# Patient Record
Sex: Female | Born: 1990
Health system: Southern US, Community
[De-identification: ages and names within clinical notes are randomized; demographics above are authoritative.]

## PROBLEM LIST (undated history)

## (undated) DIAGNOSIS — A599 Trichomoniasis, unspecified: Secondary | ICD-10-CM

## (undated) DIAGNOSIS — D573 Sickle-cell trait: Secondary | ICD-10-CM

## (undated) DIAGNOSIS — Z8659 Personal history of other mental and behavioral disorders: Secondary | ICD-10-CM

## (undated) DIAGNOSIS — I10 Essential (primary) hypertension: Secondary | ICD-10-CM

## (undated) DIAGNOSIS — A749 Chlamydial infection, unspecified: Secondary | ICD-10-CM

## (undated) DIAGNOSIS — E119 Type 2 diabetes mellitus without complications: Secondary | ICD-10-CM

## (undated) HISTORY — DX: Personal history of other mental and behavioral disorders: Z86.59

## (undated) HISTORY — DX: Type 2 diabetes mellitus without complications: E11.9

## (undated) HISTORY — DX: Morbid (severe) obesity due to excess calories: E66.01

## (undated) HISTORY — DX: Sickle-cell trait: D57.3

## (undated) HISTORY — PX: TUBAL LIGATION: SHX77

## (undated) HISTORY — DX: Essential (primary) hypertension: I10

---

## 2018-05-05 DIAGNOSIS — A749 Chlamydial infection, unspecified: Secondary | ICD-10-CM

## 2018-05-05 HISTORY — DX: Chlamydial infection, unspecified: A74.9

## 2018-06-06 ENCOUNTER — Inpatient Hospital Stay (HOSPITAL_COMMUNITY)
Admission: AD | Admit: 2018-06-06 | Discharge: 2018-06-06 | Disposition: A | Discharge status: Home / Self Care | Payer: Self-pay | Attending: Obstetrics and Gynecology | Admitting: Obstetrics and Gynecology

## 2018-06-06 ENCOUNTER — Encounter (HOSPITAL_COMMUNITY): Payer: Self-pay | Admitting: *Deleted

## 2018-06-06 DIAGNOSIS — N946 Dysmenorrhea, unspecified: Secondary | ICD-10-CM

## 2018-06-06 DIAGNOSIS — N939 Abnormal uterine and vaginal bleeding, unspecified: Secondary | ICD-10-CM

## 2018-06-06 HISTORY — DX: Chlamydial infection, unspecified: A74.9

## 2018-06-06 LAB — WET PREP, GENITAL
CLUE CELLS WET PREP: NONE SEEN
Sperm: NONE SEEN
TRICH WET PREP: NONE SEEN
YEAST WET PREP: NONE SEEN

## 2018-06-06 LAB — URINALYSIS, ROUTINE W REFLEX MICROSCOPIC
Bilirubin Urine: NEGATIVE
Glucose, UA: NEGATIVE mg/dL
HGB URINE DIPSTICK: NEGATIVE
Ketones, ur: NEGATIVE mg/dL
Leukocytes, UA: NEGATIVE
Nitrite: NEGATIVE
PH: 6 (ref 5.0–8.0)
PROTEIN: NEGATIVE mg/dL
Specific Gravity, Urine: 1.014 (ref 1.005–1.030)

## 2018-06-06 LAB — CBC
HCT: 39.8 % (ref 36.0–46.0)
Hemoglobin: 12.5 g/dL (ref 12.0–15.0)
MCH: 23.7 pg — ABNORMAL LOW (ref 26.0–34.0)
MCHC: 31.4 g/dL (ref 30.0–36.0)
MCV: 75.5 fL — ABNORMAL LOW (ref 80.0–100.0)
NRBC: 0 % (ref 0.0–0.2)
PLATELETS: 224 10*3/uL (ref 150–400)
RBC: 5.27 MIL/uL — ABNORMAL HIGH (ref 3.87–5.11)
RDW: 15.8 % — AB (ref 11.5–15.5)
WBC: 11.2 10*3/uL — AB (ref 4.0–10.5)

## 2018-06-06 LAB — POCT PREGNANCY, URINE: PREG TEST UR: NEGATIVE

## 2018-06-06 MED ORDER — KETOROLAC TROMETHAMINE 60 MG/2ML IM SOLN
60.0000 mg | Freq: Once | INTRAMUSCULAR | Status: AC
  Administered 2018-06-06: 60 mg via INTRAMUSCULAR
  Filled 2018-06-06: qty 2

## 2018-06-06 NOTE — MAU Note (Signed)
No period in October. About 2hrs ago had some abd pain in RLQ. Had heavy vag bleeding and clots for 2hrs and then completely stopped. Had BM and pain worse at first. Threw up once. Some dizziness earllier. Pain is less now about 5. Pain is on CS scar RLQ. No bleeding now. Stabbing pain

## 2018-06-06 NOTE — Discharge Instructions (Signed)
[facility]  ° ° °[facility]       Phone: [phone] ° °[facility] Phone: [phone] ° °[facility] [place]  Phone: [phone] ° °[facility] [place]  Phone: [phone] ° °[facility] [place]  Phone: [phone] ° °[facility]       Phone: [phone] ° °[facility]    Phone: [phone]  ° °[facility] ([place]    Phone: [phone] ° °[facility]    Phone: [phone] ° °[facility]    Phone: [phone] ° °[facility]    Phone: [phone] ° °[facility]       Phone: [phone]  ° °[facility]  Phone: [phone] ° °[facility]    Phone: [phone] ° °[facility]    Phone: [phone] ° °[facility]      Phone: [phone] ° °[facility]    Phone: [phone] ° ° ° °Abnormal Uterine Bleeding °Abnormal uterine bleeding means bleeding more than usual from your uterus. It can include: °· Bleeding between periods. °· Bleeding after sex. °· Bleeding that is heavier than normal. °· Periods that last longer than usual. °· Bleeding after you have stopped having your period (menopause). ° °There are many problems that may cause this. You should see a doctor for any kind of bleeding that is not normal. Treatment depends on the cause of the bleeding. °Follow these instructions at home: °· Watch your condition for any changes. °· Do not use tampons, douche, or have sex, if your doctor tells you not to. °· Change your pads often. °· Get regular well-woman exams. Make sure they include a pelvic exam and cervical cancer screening. °· Keep all follow-up visits as told by your doctor.  This is important. °Contact a doctor if: °· The bleeding lasts more than one week. °· You feel dizzy at times. °· You feel like you are going to throw up (nauseous). °· You throw up. °Get help right away if: °· You pass out. °· You have to change pads every hour. °· You have belly (abdominal) pain. °· You have a fever. °· You get sweaty. °· You get weak. °· You passing large blood clots from your vagina. °Summary °· Abnormal uterine bleeding means bleeding more than usual from your uterus. °· There are many problems that may cause this. You should see a doctor for any kind of bleeding that is not normal. °· Treatment depends on the cause of the bleeding. °This information is not intended to replace advice given to you by your health care provider. Make sure you discuss any questions you have with your health care provider. °Document Released: 05/19/2009 Document Revised: 07/16/2016 Document Reviewed: 07/16/2016 °Elsevier Interactive Patient Education © 2017 [employer] Inc[employer] ° °

## 2018-06-06 NOTE — MAU Provider Note (Signed)
History     CSN: [id number]  Arrival date and time: 06/06/18 1858   First Provider Initiated Contact with Patient 06/06/18 1947      Chief Complaint  Patient presents with  . Abdominal Pain   HPI Kristin Wood is a 27 y.o. G3P3000 non pregnant female who presents via EMS for vaginal bleeding and pain. She states this evening she had a 2 hour episode of bright red bleeding that has since stopped. She states it is time for her normal cycle. She reports severe cramping during that time but none now. She states the bleeding has since stopped. She reports being treated for trich last month but not telling her partner she had it. She has had unprotected intercourse since then. She has had a BTL and reports irregular periods since then.   OB History    Gravida  3   Para  3   Term  3   Preterm      AB      Living        SAB      TAB      Ectopic      Multiple      Live Births              Past Medical History:  Diagnosis Date  . Chlamydia 05/2018    Past Surgical History:  Procedure Laterality Date  . CESAREAN SECTION      History reviewed. No pertinent family history.  Social History   Tobacco Use  . Smoking status: Not on file  Substance Use Topics  . Alcohol use: Not on file  . Drug use: Not on file    Allergies: No Known Allergies  No medications prior to admission.    Review of Systems  Constitutional: Negative.  Negative for fatigue and fever.  HENT: Negative.   Respiratory: Negative.  Negative for shortness of breath.   Cardiovascular: Negative.  Negative for chest pain.  Gastrointestinal: Positive for abdominal pain. Negative for constipation, diarrhea, nausea and vomiting.  Genitourinary: Positive for vaginal bleeding. Negative for dysuria.  Neurological: Negative.  Negative for dizziness and headaches.   Physical Exam   Blood pressure (!) 158/91, pulse 89, temperature 98.2 F (36.8 C), resp. rate 18, height 5\' 3"  (1.6 m), weight  (!) 166 kg, last menstrual period 04/27/2018.  Physical Exam  Nursing note and vitals reviewed. Constitutional: She is oriented to person, place, and time. She appears well-developed and well-nourished. No distress.  HENT:  Head: Normocephalic.  Eyes: Pupils are equal, round, and reactive to light.  Cardiovascular: Normal rate, regular rhythm and normal heart sounds.  Respiratory: Effort normal and breath sounds normal. No respiratory distress.  GI: Soft. Bowel sounds are normal. She exhibits no distension. There is no tenderness.  Genitourinary: Vaginal discharge (Small amount of yellow discharge) found.  Genitourinary Comments: Pelvic exam: Cervix pink, visually closed, without lesion, vaginal walls and external genitalia normal Bimanual exam: Cervix 0/long/high, firm, anterior, neg CMT, uterus nontender, nonenlarged, adnexa without tenderness, enlargement, or mass   Neurological: She is alert and oriented to person, place, and time.  Skin: Skin is warm and dry.  Psychiatric: She has a normal mood and affect. Her behavior is normal. Judgment and thought content normal.    MAU Course  Procedures Results for orders placed or performed during the hospital encounter of 06/06/18 (from the past 24 hour(s))  Urinalysis, Routine w reflex microscopic     Status: None   Collection Time:  06/06/18  7:25 PM  Result Value Ref Range   Color, Urine YELLOW YELLOW   APPearance CLEAR CLEAR   Specific Gravity, Urine 1.014 1.005 - 1.030   pH 6.0 5.0 - 8.0   Glucose, UA NEGATIVE NEGATIVE mg/dL   Hgb urine dipstick NEGATIVE NEGATIVE   Bilirubin Urine NEGATIVE NEGATIVE   Ketones, ur NEGATIVE NEGATIVE mg/dL   Protein, ur NEGATIVE NEGATIVE mg/dL   Nitrite NEGATIVE NEGATIVE   Leukocytes, UA NEGATIVE NEGATIVE  Pregnancy, urine POC     Status: None   Collection Time: 06/06/18  7:35 PM  Result Value Ref Range   Preg Test, Ur NEGATIVE NEGATIVE  CBC     Status: Abnormal   Collection Time: 06/06/18  7:57  PM  Result Value Ref Range   WBC 11.2 (H) 4.0 - 10.5 K/uL   RBC 5.27 (H) 3.87 - 5.11 MIL/uL   Hemoglobin 12.5 12.0 - 15.0 g/dL   HCT 16.1 09.6 - 04.5 %   MCV 75.5 (L) 80.0 - 100.0 fL   MCH 23.7 (L) 26.0 - 34.0 pg   MCHC 31.4 30.0 - 36.0 g/dL   RDW 40.9 (H) 81.1 - 91.4 %   Platelets 224 150 - 400 K/uL   nRBC 0.0 0.0 - 0.2 %  Wet prep, genital     Status: Abnormal   Collection Time: 06/06/18  9:01 PM  Result Value Ref Range   Yeast Wet Prep HPF POC NONE SEEN NONE SEEN   Trich, Wet Prep NONE SEEN NONE SEEN   Clue Cells Wet Prep HPF POC NONE SEEN NONE SEEN   WBC, Wet Prep HPF POC FEW (A) NONE SEEN   Sperm NONE SEEN    MDM UA, UPT Wet prep and gc/chlamydia Toradol IM- patient reports relief from pain  Assessment and Plan   1. Abnormal vaginal bleeding   2. Menstrual cramp    -Discharge home in stable condition -Encouraged patient to use ibuprofen as needed for pain -Vaginal bleeding and safe sexual precautions discussed -Patient advised to follow-up with gyn of choice for routine needs, list given -Patient may return to MAU as needed or if her condition were to change or worsen   Rolm Bookbinder CNM 06/06/2018, 9:01 PM

## 2018-06-08 LAB — GC/CHLAMYDIA PROBE AMP (~~LOC~~) NOT AT ARMC
CHLAMYDIA, DNA PROBE: NEGATIVE
NEISSERIA GONORRHEA: NEGATIVE

## 2019-05-27 ENCOUNTER — Emergency Department (HOSPITAL_BASED_OUTPATIENT_CLINIC_OR_DEPARTMENT_OTHER)
Admission: EM | Admit: 2019-05-27 | Discharge: 2019-05-27 | Disposition: A | Discharge status: Home / Self Care | Payer: Medicaid - Out of State | Attending: Emergency Medicine | Admitting: Emergency Medicine

## 2019-05-27 ENCOUNTER — Encounter (HOSPITAL_BASED_OUTPATIENT_CLINIC_OR_DEPARTMENT_OTHER): Payer: Self-pay | Admitting: *Deleted

## 2019-05-27 ENCOUNTER — Other Ambulatory Visit: Payer: Self-pay

## 2019-05-27 DIAGNOSIS — B3731 Acute candidiasis of vulva and vagina: Secondary | ICD-10-CM

## 2019-05-27 DIAGNOSIS — F17210 Nicotine dependence, cigarettes, uncomplicated: Secondary | ICD-10-CM | POA: Diagnosis not present

## 2019-05-27 DIAGNOSIS — N899 Noninflammatory disorder of vagina, unspecified: Secondary | ICD-10-CM | POA: Diagnosis present

## 2019-05-27 DIAGNOSIS — B373 Candidiasis of vulva and vagina: Secondary | ICD-10-CM | POA: Insufficient documentation

## 2019-05-27 LAB — URINALYSIS, MICROSCOPIC (REFLEX)

## 2019-05-27 LAB — URINALYSIS, ROUTINE W REFLEX MICROSCOPIC
Bilirubin Urine: NEGATIVE
Glucose, UA: >=500 mg/dL — AB
Hgb urine dipstick: NEGATIVE
Ketones, ur: NEGATIVE mg/dL
Leukocytes,Ua: NEGATIVE
Nitrite: NEGATIVE
Protein, ur: NEGATIVE mg/dL
Specific Gravity, Urine: 1.02 (ref 1.005–1.030)
pH: 6 (ref 5.0–8.0)

## 2019-05-27 LAB — PREGNANCY, URINE: Preg Test, Ur: NEGATIVE

## 2019-05-27 LAB — WET PREP, GENITAL
Clue Cells Wet Prep HPF POC: NONE SEEN
Sperm: NONE SEEN
Trich, Wet Prep: NONE SEEN

## 2019-05-27 LAB — HIV ANTIBODY (ROUTINE TESTING W REFLEX): HIV Screen 4th Generation wRfx: NONREACTIVE

## 2019-05-27 MED ORDER — KETOCONAZOLE 2 % EX CREA: 1.0000 "application " | TOPICAL_CREAM | Freq: Every day | CUTANEOUS | 0 refills | Status: DC

## 2019-05-27 MED ORDER — FLUCONAZOLE 150 MG PO TABS
150.0000 mg | ORAL_TABLET | Freq: Once | ORAL | Status: AC
  Administered 2019-05-27: 150 mg via ORAL
  Filled 2019-05-27: qty 1

## 2019-05-27 MED FILL — KETOCONAZOLE 2% CREAM: 2 | 14 days supply | Qty: 30 | Fill #0

## 2019-05-27 NOTE — Discharge Instructions (Signed)
Contact a health care provider if:  You have a fever.  Your symptoms go away and then return.  Your symptoms do not get better with treatment.  Your symptoms get worse.  You have new symptoms.  You develop blisters in or around your vagina.  You have blood coming from your vagina and it is not your menstrual period.  You develop pain in your abdomen.

## 2019-05-27 NOTE — ED Provider Notes (Signed)
[facility] EMERGENCY DEPARTMENT Provider Note   CSN: [id number] Arrival date & time: 05/27/19  1106     History   Chief Complaint Chief Complaint  Patient presents with  . Vaginal Discharge    HPI Kristin Wood is a 28 y.o. female who presents with vaginal discharge. She states that she was diagnosed with trich 32weeks ago and treated, but on the last day of abx had intercourse with husband who has not yet been treated and had recurrence of green discharge and vaginal irritation.  She also has a hx of HSV2 with concurrent outbreak. On Valtrex. Feels irritated with painful blisters. Patient requests self swab and declines pelvic exam. Patient was treated for PID at the time      HPI  Past Medical History:  Diagnosis Date  . Chlamydia 05/2018    There are no active problems to display for this patient.   Past Surgical History:  Procedure Laterality Date  . CESAREAN SECTION       OB History    Gravida  3   Para  3   Term  3   Preterm      AB      Living        SAB      TAB      Ectopic      Multiple      Live Births               Home Medications    Prior to Admission medications   Not on File    Family History History reviewed. No pertinent family history.  Social History Social History   Tobacco Use  . Smoking status: Current Every Day Smoker  . Smokeless tobacco: Never Used  Substance Use Topics  . Alcohol use: Not on file  . Drug use: Not on file     Allergies   Patient has no known allergies.   Review of Systems Review of Systems Ten systems reviewed and are negative for acute change, except as noted in the HPI.    Physical Exam Updated Vital Signs Ht 5\' 3"  (1.6 m)   Wt (!) 171.5 kg   LMP 05/06/2019   BMI 66.96 kg/m   Physical Exam Vitals signs and nursing note reviewed.  Constitutional:      General: She is not in acute distress.    Appearance: She is well-developed. She is obese. She is not  diaphoretic.  HENT:     Head: Normocephalic and atraumatic.  Eyes:     General: No scleral icterus.    Conjunctiva/sclera: Conjunctivae normal.  Neck:     Musculoskeletal: Normal range of motion.  Cardiovascular:     Rate and Rhythm: Normal rate and regular rhythm.     Heart sounds: Normal heart sounds. No murmur. No friction rub. No gallop.   Pulmonary:     Effort: Pulmonary effort is normal. No respiratory distress.     Breath sounds: Normal breath sounds.  Abdominal:     General: Bowel sounds are normal. There is no distension.     Palpations: Abdomen is soft. There is no mass.     Tenderness: There is no abdominal tenderness. There is no guarding.  Skin:    General: Skin is warm and dry.  Neurological:     Mental Status: She is alert and oriented to person, place, and time.  Psychiatric:        Behavior: Behavior normal.  ED Treatments / Results  Labs (all labs ordered are listed, but only abnormal results are displayed) Labs Reviewed  WET PREP, GENITAL  RPR  URINALYSIS, ROUTINE W REFLEX MICROSCOPIC  HIV ANTIBODY (ROUTINE TESTING W REFLEX)  PREGNANCY, URINE  GC/CHLAMYDIA PROBE AMP ([place]) NOT AT [facility]    EKG None  Radiology No results found.  Procedures Procedures (including critical care time)  Medications Ordered in ED Medications - No data to display   Initial Impression / Assessment and Plan / ED Course  I have reviewed the triage vital signs and the nursing notes.  Pertinent labs & imaging results that were available during my care of the patient were reviewed by me and considered in my medical decision making (see chart for details).      Patient here with complaint of vaginal irritation, recent trach and HSV outbreak.  Patient wet prep shows yeast infection.  Urine is without Signs of infection.  She does have elevated blood glucose in her urine and I have discussed this with the patient and she may need further work-up for diabetes.   Remainder of her labs are pending.  She was appropriate for discharge at this time.   Final Clinical Impressions(s) / ED Diagnoses   Final diagnoses:  Vaginal yeast infection    ED Discharge Orders    None       Margarita Mail, PA-C 05/27/19 1845    Little, Wenda Overland, MD 05/28/19 [phone]

## 2019-05-27 NOTE — ED Notes (Signed)
ED Provider at bedside. 

## 2019-05-27 NOTE — ED Triage Notes (Signed)
Pt reports she was dx with trichomonis 2 weeks ago in [place], rx meds, finished them up and now cont with sx. Discharge is green with foul odor.

## 2019-05-28 LAB — RPR: RPR Ser Ql: NONREACTIVE

## 2019-05-31 LAB — GC/CHLAMYDIA PROBE AMP (~~LOC~~) NOT AT ARMC
Chlamydia: NEGATIVE
Neisseria Gonorrhea: NEGATIVE

## 2019-07-07 ENCOUNTER — Emergency Department (HOSPITAL_BASED_OUTPATIENT_CLINIC_OR_DEPARTMENT_OTHER)
Admission: EM | Admit: 2019-07-07 | Discharge: 2019-07-07 | Disposition: A | Discharge status: Home / Self Care | Payer: Medicaid - Out of State | Attending: Emergency Medicine | Admitting: Emergency Medicine

## 2019-07-07 ENCOUNTER — Other Ambulatory Visit: Payer: Self-pay

## 2019-07-07 ENCOUNTER — Encounter (HOSPITAL_BASED_OUTPATIENT_CLINIC_OR_DEPARTMENT_OTHER): Payer: Self-pay

## 2019-07-07 DIAGNOSIS — N898 Other specified noninflammatory disorders of vagina: Secondary | ICD-10-CM

## 2019-07-07 DIAGNOSIS — F17210 Nicotine dependence, cigarettes, uncomplicated: Secondary | ICD-10-CM | POA: Diagnosis not present

## 2019-07-07 DIAGNOSIS — N899 Noninflammatory disorder of vagina, unspecified: Secondary | ICD-10-CM | POA: Diagnosis present

## 2019-07-07 DIAGNOSIS — R358 Other polyuria: Secondary | ICD-10-CM | POA: Diagnosis not present

## 2019-07-07 DIAGNOSIS — R631 Polydipsia: Secondary | ICD-10-CM | POA: Diagnosis not present

## 2019-07-07 DIAGNOSIS — R739 Hyperglycemia, unspecified: Secondary | ICD-10-CM | POA: Insufficient documentation

## 2019-07-07 DIAGNOSIS — R7309 Other abnormal glucose: Secondary | ICD-10-CM

## 2019-07-07 HISTORY — DX: Trichomoniasis, unspecified: A59.9

## 2019-07-07 LAB — URINALYSIS, ROUTINE W REFLEX MICROSCOPIC
Bilirubin Urine: NEGATIVE
Glucose, UA: >=500 mg/dL — AB
Ketones, ur: NEGATIVE mg/dL
Leukocytes,Ua: NEGATIVE
Nitrite: NEGATIVE
Protein, ur: NEGATIVE mg/dL
Specific Gravity, Urine: 1.02 (ref 1.005–1.030)
pH: 6 (ref 5.0–8.0)

## 2019-07-07 LAB — WET PREP, GENITAL
Clue Cells Wet Prep HPF POC: NONE SEEN
Sperm: NONE SEEN
Trich, Wet Prep: NONE SEEN
Yeast Wet Prep HPF POC: NONE SEEN

## 2019-07-07 LAB — URINALYSIS, MICROSCOPIC (REFLEX)

## 2019-07-07 LAB — CBG MONITORING, ED: Glucose-Capillary: 240 mg/dL — ABNORMAL HIGH (ref 70–99)

## 2019-07-07 LAB — PREGNANCY, URINE: Preg Test, Ur: NEGATIVE

## 2019-07-07 NOTE — Discharge Planning (Signed)
Marcena Dias J. Clydene Laming, RN, BSN, [place] [phone]  [facility] set up appointment with Primary Care at [facility] on 1/12 @ 8:50.  Appointment date, time and address placed on AVS.

## 2019-07-07 NOTE — Discharge Instructions (Addendum)
As discussed, your wet prep was negative for yeast, trich, and BV. The vaginal irritation can be caused by the excessive rubbing. Try to limit the amount of rubbing in that area so it can heal. You can use vaseline on the outside of your vagina to help with irritation. If you still think it is yeast, you can get over the counter yeast cream. I would not have intercourse until the irritation has improved because it may make it worse. You have been scheduled an appointment to follow-up for your elevated glucose levels. I have also included the number for [facility]. Your gonorrhea and chlamydia results are still pending. You will receive a phone call if they are positive. Return to the ER for new or worsening symptoms.

## 2019-07-07 NOTE — ED Provider Notes (Signed)
[facility] EMERGENCY DEPARTMENT Provider Note   CSN: [id number] Arrival date & time: 07/07/19  1218     History   Chief Complaint Chief Complaint  Patient presents with   Vaginal Discharge    HPI Kristin Wood is a 28 y.o. female with no significant past medical history who presents to the ED for an evaluation of vaginal discharge for the past 3 days.  Patient notes she was treated for a yeast infection on 10/22 and states it feels very similar. Patient is currently married and only sexually active with her husband. Patient also notes she has been experiencing dysuria for the past few days but denies associative back pain, fever, and chills. Patient is also worried about possible diabetes. She notes she has been checking her glucose at home for the past few weeks which ranges in 300-400s. Patient admits to polyuria and polydipsia for the past few months. She has never been diagnosed with DM, but has poor follow-up. Patient denies shortness of breath, headaches, and chest pain.   Past Medical History:  Diagnosis Date   Chlamydia 05/2018   Trichimoniasis     There are no active problems to display for this patient.   Past Surgical History:  Procedure Laterality Date   CESAREAN SECTION     TUBAL LIGATION       OB History    Gravida  3   Para  3   Term  3   Preterm      AB      Living        SAB      TAB      Ectopic      Multiple      Live Births               Home Medications    Prior to Admission medications   Medication Sig Start Date End Date Taking? Authorizing Provider  ketoconazole (NIZORAL) 2 % cream Apply 1 application topically daily. Apply to the vulva. 05/27/19   Margarita Mail, PA-C    Family History No family history on file.  Social History Social History   Tobacco Use   Smoking status: Current Every Day Smoker    Types: Cigarettes   Smokeless tobacco: Never Used  Substance Use Topics   Alcohol use:  Never    Frequency: Never   Drug use: Never     Allergies   Patient has no known allergies.   Review of Systems Review of Systems  Constitutional: Negative for chills and fever.  Respiratory: Negative for shortness of breath.   Cardiovascular: Negative for chest pain.  Gastrointestinal: Positive for abdominal pain (suprapubic). Negative for diarrhea, nausea and vomiting.  Genitourinary: Positive for dysuria, frequency, vaginal discharge and vaginal pain (irritation). Negative for flank pain and hematuria.     Physical Exam Updated Vital Signs BP 130/76 (BP Location: Right Arm)    Pulse 93    Temp 98.3 F (36.8 C) (Oral)    Resp 18    Ht 5\' 3"  (1.6 m)    Wt (!) 169.6 kg    LMP 06/10/2019    SpO2 100%    BMI 66.25 kg/m   Physical Exam Vitals signs and nursing note reviewed. Exam conducted with a chaperone present.  Constitutional:      General: She is not in acute distress. HENT:     Head: Normocephalic.  Eyes:     Pupils: Pupils are equal, round, and reactive to light.  Neck:     Musculoskeletal: Neck supple.  Cardiovascular:     Rate and Rhythm: Normal rate and regular rhythm.     Pulses: Normal pulses.     Heart sounds: Normal heart sounds. No murmur. No friction rub. No gallop.   Pulmonary:     Effort: Pulmonary effort is normal.     Breath sounds: Normal breath sounds.  Abdominal:     General: Abdomen is flat. Bowel sounds are normal. There is no distension.     Palpations: Abdomen is soft.     Tenderness: There is no abdominal tenderness. There is no right CVA tenderness, left CVA tenderness or guarding.     Comments: Abdomen soft, nondistended, nontender to palpation in all quadrants without guarding or peritoneal signs. No rebound.   Genitourinary:    Exam position: Supine.     Labia:        Right: No rash or tenderness.        Left: No rash or tenderness.      Vagina: Normal. No vaginal discharge.     Comments: Erythema and irritation noted around vaginal  opening.  Musculoskeletal: Normal range of motion.     Comments: Able to move all 4 extremities without difficulty. No lower extremity edema.  Skin:    General: Skin is warm and dry.  Neurological:     General: No focal deficit present.      ED Treatments / Results  Labs (all labs ordered are listed, but only abnormal results are displayed) Labs Reviewed  WET PREP, GENITAL - Abnormal; Notable for the following components:      Result Value   WBC, Wet Prep HPF POC FEW (*)    All other components within normal limits  URINALYSIS, ROUTINE W REFLEX MICROSCOPIC - Abnormal; Notable for the following components:   Glucose, UA >=500 (*)    Hgb urine dipstick TRACE (*)    All other components within normal limits  URINALYSIS, MICROSCOPIC (REFLEX) - Abnormal; Notable for the following components:   Bacteria, UA RARE (*)    All other components within normal limits  CBG MONITORING, ED - Abnormal; Notable for the following components:   Glucose-Capillary 240 (*)    All other components within normal limits  PREGNANCY, URINE  GC/CHLAMYDIA PROBE AMP ([place]) NOT AT [facility]    EKG None  Radiology No results found.  Procedures Procedures (including critical care time)  Medications Ordered in ED Medications - No data to display   Initial Impression / Assessment and Plan / ED Course  I have reviewed the triage vital signs and the nursing notes.  Pertinent labs & imaging results that were available during my care of the patient were reviewed by me and considered in my medical decision making (see chart for details).       28 year old female presents to the ED for multiple complaints of vaginal discharge and concern for diabetes. Patient is afebrile, non-septic, and non-ill appearing. Patient in no acute distress. Abdomen soft, non-distended, and non-tender. No CVA tenderness bilaterally. Patient deferred pelvic exam and wants to self swab. Will obtain UA to rule out UTI. Will  obtain wet prep/ GC/Ch with self swab since patient deferred pelvic exam to rule out STDs and vaginitis. Will obtain POC CBG.   Wet prep negative for BV, yeast, and trichomonas. UA significant for glucosuria and hematuria. No signs of infection. Negative urine pregnancy test. GC/Chlamydia pending. CBG 240. Suspect patient has undiagnosed diabetes, doubt DKA  at this time.   Discussed results with patient and she is adamant she has a yeast infection. Attempted to perform a pelvic exam, but patient stopped me stating she is too irritated to undergo a full pelvic exam. No noticeable lesions or discharge. Vaginal opening appears irritated. Offered to start patient on metformin, but patient deferred given she has issues with her gallbladder and concerned about starting a new medication. Patient also deferred prophylactic treatment for gonorrhea and chlamydia. Suspect vaginal irritation is from patient continuously scratching with dry tissues.   Patient has been scheduled an appointment [facility] 08[facility]17/2019 at 8:50 to follow-up with elevated glucose. Patient also given number for [facility]. Patient advised to sustain from intercourse until vaginal irritation resolves. Strict ED precautions discussed with patient. Patient states understanding and agrees to plan. Patient discharged home in no acute distress and stable vitals  Final Clinical Impressions(s) / ED Diagnoses   Final diagnoses:  Vaginal discharge  Elevated glucose    ED Discharge Orders    None       Renee HarderCheek, Jazlynn Nemetz B, PA-C 07/07/19 1949    Virgina NorfolkCuratolo, Adam, DO 07/08/19 [phone]

## 2019-07-07 NOTE — ED Triage Notes (Signed)
Pt c/o vaginal d/c x 3 days-increase urination x 2 months-states she wants to be checked for DM-NAD-steady gait

## 2019-07-09 LAB — GC/CHLAMYDIA PROBE AMP (~~LOC~~) NOT AT ARMC
Chlamydia: NEGATIVE
Neisseria Gonorrhea: NEGATIVE

## 2019-07-11 ENCOUNTER — Emergency Department (HOSPITAL_BASED_OUTPATIENT_CLINIC_OR_DEPARTMENT_OTHER)
Admission: EM | Admit: 2019-07-11 | Discharge: 2019-07-11 | Disposition: A | Discharge status: Home / Self Care | Payer: Medicaid - Out of State | Attending: Emergency Medicine | Admitting: Emergency Medicine

## 2019-07-11 ENCOUNTER — Encounter (HOSPITAL_BASED_OUTPATIENT_CLINIC_OR_DEPARTMENT_OTHER): Payer: Self-pay | Admitting: Emergency Medicine

## 2019-07-11 ENCOUNTER — Other Ambulatory Visit: Payer: Self-pay

## 2019-07-11 DIAGNOSIS — R739 Hyperglycemia, unspecified: Secondary | ICD-10-CM | POA: Diagnosis not present

## 2019-07-11 DIAGNOSIS — F17210 Nicotine dependence, cigarettes, uncomplicated: Secondary | ICD-10-CM | POA: Insufficient documentation

## 2019-07-11 DIAGNOSIS — M79645 Pain in left finger(s): Secondary | ICD-10-CM | POA: Diagnosis not present

## 2019-07-11 DIAGNOSIS — L089 Local infection of the skin and subcutaneous tissue, unspecified: Secondary | ICD-10-CM | POA: Insufficient documentation

## 2019-07-11 MED ORDER — METFORMIN HCL 500 MG PO TABS: 500.0000 mg | ORAL_TABLET | Freq: Two times a day (BID) | ORAL | 1 refills | Status: DC

## 2019-07-11 MED ORDER — CEPHALEXIN 500 MG PO CAPS: 500.0000 mg | ORAL_CAPSULE | Freq: Four times a day (QID) | ORAL | 0 refills | Status: DC

## 2019-07-11 MED ORDER — LIVING WELL WITH DIABETES BOOK
Status: AC
  Filled 2019-07-11: qty 1

## 2019-07-11 MED ORDER — LIDOCAINE HCL 2 % IJ SOLN
10.0000 mL | Freq: Once | INTRAMUSCULAR | Status: AC
  Administered 2019-07-11: 200 mg
  Filled 2019-07-11: qty 20

## 2019-07-11 NOTE — ED Notes (Signed)
Pt complaining to EDP of elevated blood sugars and recurrent boils to buttocks.

## 2019-07-11 NOTE — ED Triage Notes (Signed)
Patient states that she she had a hang nail on her left ring finger  - she states that she now has whiteness around the nail and it is throbbing in pain. The patient reports that it has been red intermittently

## 2019-07-11 NOTE — ED Provider Notes (Signed)
[facility] EMERGENCY DEPARTMENT Provider Note   CSN: [id number] Arrival date & time: 07/11/19  0007     History   Chief Complaint Chief Complaint  Patient presents with  . Hand Pain    HPI Kristin Wood is a 28 y.o. female.     HPI  This is a 28 year old female who presents with finger infection.  Patient reports she has noted increasing pain redness to the left fourth digit.  She reports that she recently started biting her nails again.  Pain increased today and she noticed streaking over the medial aspect of the finger.  She is not noted any fevers.  Currently she rates her pain at 8 out of 10.  She has not taken anything for the pain.  Of note, she has also noted continued blood sugars in the 200-300 range at home.  No established diagnosis of diabetes.  She does have a primary follow-up in the beginning of January.  She was seen and evaluated several days ago and deferred taking Metformin however, she states today that she is interested in starting a diabetes medication.  She is not noted any other systemic symptoms including vision changes, polyuria, polydipsia.  Past Medical History:  Diagnosis Date  . Chlamydia 05/2018  . Trichimoniasis     There are no active problems to display for this patient.   Past Surgical History:  Procedure Laterality Date  . CESAREAN SECTION    . TUBAL LIGATION       OB History    Gravida  3   Para  3   Term  3   Preterm      AB      Living        SAB      TAB      Ectopic      Multiple      Live Births               Home Medications    Prior to Admission medications   Medication Sig Start Date End Date Taking? Authorizing Provider  cephALEXin (KEFLEX) 500 MG capsule Take 1 capsule (500 mg total) by mouth 4 (four) times daily. 07/11/19   Lanai Conlee, Mayer Masker, MD  ketoconazole (NIZORAL) 2 % cream Apply 1 application topically daily. Apply to the vulva. 05/27/19   Arthor Captain, PA-C  metFORMIN  (GLUCOPHAGE) 500 MG tablet Take 1 tablet (500 mg total) by mouth 2 (two) times daily with a meal. 07/11/19   Sameera Betton, Mayer Masker, MD    Family History History reviewed. No pertinent family history.  Social History Social History   Tobacco Use  . Smoking status: Current Every Day Smoker    Types: Cigarettes  . Smokeless tobacco: Never Used  Substance Use Topics  . Alcohol use: Never    Frequency: Never  . Drug use: Never     Allergies   Patient has no known allergies.   Review of Systems Review of Systems  Constitutional: Negative for fever.  Respiratory: Negative for shortness of breath.   Cardiovascular: Negative for chest pain.  Gastrointestinal: Negative for abdominal pain.  Endocrine: Negative for polydipsia and polyuria.  Skin: Positive for color change.  All other systems reviewed and are negative.    Physical Exam Updated Vital Signs BP 135/78 (BP Location: Right Arm)   Pulse 93   Temp 98.5 F (36.9 C) (Oral)   Resp 20   Ht 1.6 m (5\' 3" )   Wt (!) 169.6 kg  SpO2 99%   BMI 66.23 kg/m   Physical Exam Vitals signs and nursing note reviewed.  Constitutional:      Appearance: She is well-developed. She is obese. She is not ill-appearing.  HENT:     Head: Normocephalic and atraumatic.     Mouth/Throat:     Mouth: Mucous membranes are moist.  Eyes:     Pupils: Pupils are equal, round, and reactive to light.  Neck:     Musculoskeletal: Neck supple.  Cardiovascular:     Rate and Rhythm: Normal rate and regular rhythm.  Pulmonary:     Effort: Pulmonary effort is normal. No respiratory distress.     Breath sounds: No wheezing.  Musculoskeletal:     Comments: Tenderness palpation over the lateral aspect of the left fourth digit at the nailbed, slight fluctuance noted, there is slight erythema that extends proximally, normal flexion and extension at the joints  Skin:    General: Skin is warm and dry.  Neurological:     Mental Status: She is alert and  oriented to person, place, and time.  Psychiatric:        Mood and Affect: Mood normal.      ED Treatments / Results  Labs (all labs ordered are listed, but only abnormal results are displayed) Labs Reviewed - No data to display  EKG None  Radiology No results found.  Procedures .Marland Kitchen.Incision and Drainage  Date/Time: 07/11/2019 1:34 AM Performed by: Shon BatonHorton, Jaedyn Marrufo F, MD Authorized by: Shon BatonHorton, Demarques Pilz F, MD   Consent:    Consent obtained:  Verbal   Consent given by:  Patient   Risks discussed:  Bleeding, incomplete drainage and infection   Alternatives discussed:  No treatment Location:    Type:  Abscess   Location:  Upper extremity   Upper extremity location:  Finger   Finger location:  L ring finger Pre-procedure details:    Skin preparation:  Betadine Anesthesia (see MAR for exact dosages):    Anesthesia method:  Local infiltration   Local anesthetic:  Lidocaine 2% w/o epi Procedure type:    Complexity:  Simple Procedure details:    Needle aspiration: no     Incision types:  Stab incision   Incision depth:  Dermal   Scalpel blade:  11   Drainage:  Bloody   Drainage amount:  Scant   Packing materials:  None Post-procedure details:    Patient tolerance of procedure:  Tolerated well, no immediate complications   (including critical care time)  Medications Ordered in ED Medications  living well with diabetes book MISC (has no administration in time range)  lidocaine (XYLOCAINE) 2 % (with pres) injection 200 mg (200 mg Infiltration Given by Other 07/11/19 0032)     Initial Impression / Assessment and Plan / ED Course  I have reviewed the triage vital signs and the nursing notes.  Pertinent labs & imaging results that were available during my care of the patient were reviewed by me and considered in my medical decision making (see chart for details).        Patient presents with likely infection of her finger.  She also reports continued high blood  sugars at home.  She is overall nontoxic and vital signs are reassuring.  She had what appeared to be an early paronychia of the finger with some early cellulitis.  I did attempt incision and drainage.  Scant bloody drainage noted.  Recommend warm soaks and antibiotics.  No signs or symptoms of felon or  flexor tenosynovitis.  Regarding her high blood sugars.  She was seen and had a blood sugar noted to be 240 several days ago.  Do not feel it is unreasonable for her to start Metformin.  She was given a short-term prescription and advised to follow-up with her primary doctor as scheduled in January.  After history, exam, and medical workup I feel the patient has been appropriately medically screened and is safe for discharge home. Pertinent diagnoses were discussed with the patient. Patient was given return precautions.   Final Clinical Impressions(s) / ED Diagnoses   Final diagnoses:  Finger infection  Hyperglycemia    ED Discharge Orders         Ordered    cephALEXin (KEFLEX) 500 MG capsule  4 times daily     07/11/19 0117    metFORMIN (GLUCOPHAGE) 500 MG tablet  2 times daily with meals,   Status:  Discontinued     07/11/19 0117    metFORMIN (GLUCOPHAGE) 500 MG tablet  2 times daily with meals     07/11/19 0117           Merryl Hacker, MD 07/11/19 [phone]

## 2019-07-11 NOTE — ED Notes (Signed)
Provided new diabetes dx education book at time of discharge, encouraged to document her blood sugars until she can see PCP.

## 2019-07-11 NOTE — Discharge Instructions (Addendum)
You were seen today for an infection of your left finger.  I was unable to express any purulence.  Continue to do warm soaks 3-4 times per day.  You will be given antibiotics.  If you notice increasing redness, worsening pain, fevers, you should be reevaluated immediately.  You will be given a prescription for Metformin.  It is very important that you follow-up with your primary physician for blood sugar rechecks.

## 2019-08-16 ENCOUNTER — Telehealth: Payer: Self-pay

## 2019-08-16 NOTE — Telephone Encounter (Signed)
Called patient to do their pre-visit COVID screening.  Call went to voicemail. Unable to do prescreening.  

## 2019-08-17 ENCOUNTER — Ambulatory Visit: Payer: Medicaid - Out of State | Admitting: Family

## 2019-08-17 NOTE — Progress Notes (Signed)
Pt was a no show

## 2019-12-22 ENCOUNTER — Emergency Department (HOSPITAL_BASED_OUTPATIENT_CLINIC_OR_DEPARTMENT_OTHER): Payer: Medicaid - Out of State

## 2019-12-22 ENCOUNTER — Encounter (HOSPITAL_BASED_OUTPATIENT_CLINIC_OR_DEPARTMENT_OTHER): Payer: Self-pay

## 2019-12-22 ENCOUNTER — Emergency Department (HOSPITAL_COMMUNITY)
Admission: EM | Admit: 2019-12-22 | Discharge: 2019-12-22 | Disposition: A | Discharge status: Home / Self Care | Payer: Medicaid - Out of State | Attending: Emergency Medicine | Admitting: Emergency Medicine

## 2019-12-22 ENCOUNTER — Other Ambulatory Visit: Payer: Self-pay

## 2019-12-22 ENCOUNTER — Emergency Department (HOSPITAL_BASED_OUTPATIENT_CLINIC_OR_DEPARTMENT_OTHER)
Admission: EM | Admit: 2019-12-22 | Discharge: 2019-12-22 | Disposition: A | Discharge status: Home / Self Care | Payer: Medicaid - Out of State | Attending: Emergency Medicine | Admitting: Emergency Medicine

## 2019-12-22 ENCOUNTER — Encounter (HOSPITAL_COMMUNITY): Payer: Self-pay | Admitting: Emergency Medicine

## 2019-12-22 DIAGNOSIS — Z7984 Long term (current) use of oral hypoglycemic drugs: Secondary | ICD-10-CM | POA: Insufficient documentation

## 2019-12-22 DIAGNOSIS — I10 Essential (primary) hypertension: Secondary | ICD-10-CM | POA: Insufficient documentation

## 2019-12-22 DIAGNOSIS — K8020 Calculus of gallbladder without cholecystitis without obstruction: Secondary | ICD-10-CM | POA: Insufficient documentation

## 2019-12-22 DIAGNOSIS — R109 Unspecified abdominal pain: Secondary | ICD-10-CM

## 2019-12-22 DIAGNOSIS — K8050 Calculus of bile duct without cholangitis or cholecystitis without obstruction: Secondary | ICD-10-CM

## 2019-12-22 DIAGNOSIS — R112 Nausea with vomiting, unspecified: Secondary | ICD-10-CM | POA: Insufficient documentation

## 2019-12-22 DIAGNOSIS — F17210 Nicotine dependence, cigarettes, uncomplicated: Secondary | ICD-10-CM | POA: Diagnosis not present

## 2019-12-22 DIAGNOSIS — Z5321 Procedure and treatment not carried out due to patient leaving prior to being seen by health care provider: Secondary | ICD-10-CM | POA: Insufficient documentation

## 2019-12-22 DIAGNOSIS — E119 Type 2 diabetes mellitus without complications: Secondary | ICD-10-CM | POA: Insufficient documentation

## 2019-12-22 DIAGNOSIS — R1011 Right upper quadrant pain: Secondary | ICD-10-CM | POA: Diagnosis present

## 2019-12-22 LAB — COMPREHENSIVE METABOLIC PANEL
ALT: 24 U/L (ref 0–44)
AST: 15 U/L (ref 15–41)
Albumin: 4.2 g/dL (ref 3.5–5.0)
Alkaline Phosphatase: 100 U/L (ref 38–126)
Anion gap: 11 (ref 5–15)
BUN: 9 mg/dL (ref 6–20)
CO2: 28 mmol/L (ref 22–32)
Calcium: 9.3 mg/dL (ref 8.9–10.3)
Chloride: 101 mmol/L (ref 98–111)
Creatinine, Ser: 0.66 mg/dL (ref 0.44–1.00)
GFR calc Af Amer: >60 mL/min (ref >60)
GFR calc non Af Amer: >60 mL/min (ref >60)
Glucose, Bld: 218 mg/dL — ABNORMAL HIGH (ref 70–99)
Potassium: 4.5 mmol/L (ref 3.5–5.1)
Sodium: 140 mmol/L (ref 135–145)
Total Bilirubin: 0.6 mg/dL (ref 0.3–1.2)
Total Protein: 7.6 g/dL (ref 6.5–8.1)

## 2019-12-22 LAB — URINALYSIS, ROUTINE W REFLEX MICROSCOPIC
Bilirubin Urine: NEGATIVE
Glucose, UA: NEGATIVE mg/dL
Ketones, ur: NEGATIVE mg/dL
Leukocytes,Ua: NEGATIVE
Nitrite: NEGATIVE
Protein, ur: NEGATIVE mg/dL
Specific Gravity, Urine: 1.015 (ref 1.005–1.030)
pH: 7 (ref 5.0–8.0)

## 2019-12-22 LAB — CBC
HCT: 43.1 % (ref 36.0–46.0)
Hemoglobin: 13.9 g/dL (ref 12.0–15.0)
MCH: 25 pg — ABNORMAL LOW (ref 26.0–34.0)
MCHC: 32.3 g/dL (ref 30.0–36.0)
MCV: 77.5 fL — ABNORMAL LOW (ref 80.0–100.0)
Platelets: 255 10*3/uL (ref 150–400)
RBC: 5.56 MIL/uL — ABNORMAL HIGH (ref 3.87–5.11)
RDW: 13.9 % (ref 11.5–15.5)
WBC: 13.6 10*3/uL — ABNORMAL HIGH (ref 4.0–10.5)
nRBC: 0 % (ref 0.0–0.2)

## 2019-12-22 LAB — URINALYSIS, MICROSCOPIC (REFLEX)

## 2019-12-22 LAB — PREGNANCY, URINE: Preg Test, Ur: NEGATIVE

## 2019-12-22 LAB — LIPASE, BLOOD: Lipase: 25 U/L (ref 11–51)

## 2019-12-22 MED ORDER — FENTANYL CITRATE (PF) 100 MCG/2ML IJ SOLN
100.0000 ug | Freq: Once | INTRAMUSCULAR | Status: AC
  Administered 2019-12-22: 100 ug via INTRAVENOUS
  Filled 2019-12-22: qty 2

## 2019-12-22 MED ORDER — ONDANSETRON HCL 4 MG/2ML IJ SOLN
4.0000 mg | Freq: Once | INTRAMUSCULAR | Status: AC
  Administered 2019-12-22: 4 mg via INTRAVENOUS
  Filled 2019-12-22: qty 2

## 2019-12-22 MED ORDER — SODIUM CHLORIDE 0.9% FLUSH
3.0000 mL | Freq: Once | INTRAVENOUS | Status: DC
  Filled 2019-12-22: qty 3

## 2019-12-22 MED ORDER — HYDROCODONE-ACETAMINOPHEN 5-325 MG PO TABS: 1.0000 | ORAL_TABLET | Freq: Four times a day (QID) | ORAL | 0 refills | Status: DC | PRN

## 2019-12-22 MED ORDER — HYDROMORPHONE HCL 1 MG/ML IJ SOLN
1.0000 mg | Freq: Once | INTRAMUSCULAR | Status: AC
  Administered 2019-12-22: 1 mg via INTRAVENOUS
  Filled 2019-12-22: qty 1

## 2019-12-22 MED ORDER — ONDANSETRON 4 MG PO TBDP: 4.0000 mg | ORAL_TABLET | Freq: Three times a day (TID) | ORAL | 0 refills | Status: DC | PRN

## 2019-12-22 NOTE — ED Notes (Signed)
Ice chips provided per pt request and ok by EDP.

## 2019-12-22 NOTE — ED Notes (Signed)
Patient refuses lab work at this time, says she is in too much pain and wants pain meds.

## 2019-12-22 NOTE — ED Notes (Signed)
Ginger Ale provided for po challenge 

## 2019-12-22 NOTE — ED Triage Notes (Signed)
Arrives via EMS from a hotel, C/C abd pain. Patient had [employer] around 1am this morning, woke up with 9/10 abd pain. Endorses N/V. Hx of gallbladder issues, was told she needed to have sx on her gallbladder in 2018, patient declined. CBG 226.

## 2019-12-22 NOTE — ED Provider Notes (Signed)
[facility] EMERGENCY DEPARTMENT Provider Note   CSN: [id number] Arrival date & time: 12/22/19  1423     History Chief Complaint  Patient presents with  . Abdominal Pain    Kristin Wood is a 29 y.o. female past history of chlamydia, diabetes, hypertension, sickle cell trait who presents for evaluation of right upper quadrant abdominal pain, vomiting that began this morning about 7 AM.  She reports that since she woke up this morning, she has been having pain to her upper abdomen.  She describes it as a constant sharp pain.  She also reports nausea/vomiting.  Emesis is nonbloody, nonbilious.  She states that she has not been able to tolerate much p.o.  She states that she feels like she is having a "gallbladder attack."  She reports that she was told that she had gallbladder stones few years ago.  She reports that since then, she has had intermittent flares.  Her last one was in July 2020.  She was working with a doctor in her neck region need to get her gallbladder taken out but she backed out of the surgery.  She has not had much issue since then.  Denies any fevers, urinary complaints, chest pain, difficulty breathing.  The history is provided by the patient.       Past Medical History:  Diagnosis Date  . Chlamydia 05/2018  . Diabetes (HCC)   . H/O bipolar disorder   . Hypertension   . Morbid obesity (HCC)   . Sickle cell trait (HCC)   . Trichimoniasis     There are no problems to display for this patient.   Past Surgical History:  Procedure Laterality Date  . CESAREAN SECTION    . TUBAL LIGATION       OB History    Gravida  3   Para  3   Term  3   Preterm      AB      Living        SAB      TAB      Ectopic      Multiple      Live Births              Family History  Problem Relation Age of Onset  . Thyroid cancer Mother   . Diabetes Mother   . Hypertension Mother   . Diabetes Maternal Grandmother   . Stroke Maternal  Grandmother   . Stroke Maternal Grandfather   . Breast cancer Maternal Aunt   . Diabetes Maternal Aunt   . Diabetes Maternal Aunt     Social History   Tobacco Use  . Smoking status: Current Every Day Smoker    Types: Cigarettes  . Smokeless tobacco: Never Used  Substance Use Topics  . Alcohol use: Never  . Drug use: Never    Home Medications Prior to Admission medications   Medication Sig Start Date End Date Taking? Authorizing Provider  HYDROcodone-acetaminophen (NORCO/VICODIN) 5-325 MG tablet Take 1-2 tablets by mouth every 6 (six) hours as needed. 12/22/19   Maxwell Caul, PA-C  metFORMIN (GLUCOPHAGE) 500 MG tablet Take 1 tablet (500 mg total) by mouth 2 (two) times daily with a meal. 07/11/19   Horton, Mayer Masker, MD  ondansetron (ZOFRAN ODT) 4 MG disintegrating tablet Take 1 tablet (4 mg total) by mouth every 8 (eight) hours as needed for nausea or vomiting. 12/22/19   Maxwell Caul, PA-C    Allergies  Patient has no known allergies.  Review of Systems   Review of Systems  Constitutional: Negative for fever.  Respiratory: Negative for cough and shortness of breath.   Cardiovascular: Negative for chest pain.  Gastrointestinal: Positive for abdominal pain, nausea and vomiting. Negative for constipation.  Genitourinary: Negative for dysuria and hematuria.  Neurological: Negative for headaches.  All other systems reviewed and are negative.   Physical Exam Updated Vital Signs BP (!) 142/78 (BP Location: Left Arm)   Pulse 84   Temp 98.5 F (36.9 C) (Oral)   Resp 18   Ht 5\' 3"  (1.6 m)   Wt (!) 169.2 kg   LMP 12/17/2019   SpO2 99%   BMI 66.07 kg/m   Physical Exam Vitals and nursing note reviewed.  Constitutional:      Appearance: Normal appearance. She is well-developed.  HENT:     Head: Normocephalic and atraumatic.  Eyes:     General: Lids are normal.     Conjunctiva/sclera: Conjunctivae normal.     Pupils: Pupils are equal, round, and reactive to  light.  Cardiovascular:     Rate and Rhythm: Normal rate and regular rhythm.     Pulses: Normal pulses.     Heart sounds: Normal heart sounds. No murmur. No friction rub. No gallop.   Pulmonary:     Effort: Pulmonary effort is normal.     Breath sounds: Normal breath sounds.     Comments: Lungs clear to auscultation bilaterally.  Symmetric chest rise.  No wheezing, rales, rhonchi. Abdominal:     Palpations: Abdomen is soft. Abdomen is not rigid.     Tenderness: There is abdominal tenderness in the right upper quadrant. There is no right CVA tenderness, left CVA tenderness or guarding.     Comments: Abdomen soft, nondistended.  Tenderness noted to the right upper quadrant.  No rigidity, guarding.  No CVA tenderness noted bilaterally.  Musculoskeletal:        General: Normal range of motion.     Cervical back: Full passive range of motion without pain.  Skin:    General: Skin is warm and dry.     Capillary Refill: Capillary refill takes less than 2 seconds.  Neurological:     Mental Status: She is alert and oriented to person, place, and time.  Psychiatric:        Speech: Speech normal.     ED Results / Procedures / Treatments   Labs (all labs ordered are listed, but only abnormal results are displayed) Labs Reviewed  COMPREHENSIVE METABOLIC PANEL - Abnormal; Notable for the following components:      Result Value   Glucose, Bld 218 (*)    All other components within normal limits  CBC - Abnormal; Notable for the following components:   WBC 13.6 (*)    RBC 5.56 (*)    MCV 77.5 (*)    MCH 25.0 (*)    All other components within normal limits  URINALYSIS, ROUTINE W REFLEX MICROSCOPIC - Abnormal; Notable for the following components:   Hgb urine dipstick LARGE (*)    All other components within normal limits  URINALYSIS, MICROSCOPIC (REFLEX) - Abnormal; Notable for the following components:   Bacteria, UA MANY (*)    All other components within normal limits  LIPASE, BLOOD    PREGNANCY, URINE    EKG None  Radiology US Abdomen Limited RUQ  Result Date: 12/22/2019 CLINICAL DATA:  Right upper quadrant pain with nausea and vomiting. EXAM: ULTRASOUND ABDOMEN  LIMITED RIGHT UPPER QUADRANT COMPARISON:  None. FINDINGS: Gallbladder: Multiple echogenic gallstones are seen (the largest measures 1.3 cm). There is no evidence of gallbladder wall thickening (3.6 mm). No sonographic Murphy sign noted by sonographer. Common bile duct: Diameter: 2.8 mm Liver: No focal lesion identified. There is diffusely increased echogenicity of the liver parenchyma. Portal vein is patent on color Doppler imaging with normal direction of blood flow towards the liver. Other: It should be noted that the study is limited secondary to the patient's body habitus. IMPRESSION: 1. Cholelithiasis without evidence of acute cholecystitis. 2. Fatty liver. Electronically Signed   By: Aram Candela M.D.   On: 12/22/2019 18:37    Procedures Procedures (including critical care time)  Medications Ordered in ED Medications  sodium chloride flush (NS) 0.9 % injection 3 mL (3 mLs Intravenous Not Given 12/22/19 1803)  fentaNYL (SUBLIMAZE) injection 100 mcg (100 mcg Intravenous Given 12/22/19 1812)  ondansetron (ZOFRAN) injection 4 mg (4 mg Intravenous Given 12/22/19 1811)  HYDROmorphone (DILAUDID) injection 1 mg (1 mg Intravenous Given 12/22/19 2043)  ondansetron (ZOFRAN) injection 4 mg (4 mg Intravenous Given 12/22/19 2041)    ED Course  I have reviewed the triage vital signs and the nursing notes.  Pertinent labs & imaging results that were available during my care of the patient were reviewed by me and considered in my medical decision making (see chart for details).    MDM Rules/Calculators/A&P                      29 year old female who presents for evaluation of right upper quadrant abdominal pain, nausea/vomiting that began today.  She reports history of gallstones.  Was working to get her  gallbladder taken out in [place] but states that she decided not to have the surgery.  Has been doing well since July 2020.  No fevers.  On initial ED arrival, she is afebrile, nontoxic-appearing.  She appears uncomfortable but no acute distress.  On exam, she has tenderness palpation of the right upper quadrant.  No CVA tenderness.  Consider hepatobiliary etiology versus infectious etiology.  Low suspicion for GU etiology.  History/physical exam not concerning for appendicitis, PID, diverticulitis, ovarian torsion.  CBC shows slight leukocytosis of 13.6.  Lipase is unremarkable.  CMP shows normal BUN and creatinine.  LFTs within normal limit.  UA shows hemoglobin.  Patient does report she is ending her period.  No leukocytes, nitrites.  Ultrasound shows cholelithiasis with no evidence of acute cholecystitis.  Reevaluation.  Patient does report some improvement in pain though she feels like it is creeping up again.  At this time, there is no active signs of infection.  No indication for emergent cholecystectomy.  Will plan for symptom control, for outpatient follow-up GEN surge.  Reevaluation.  Patient is resting comfortably.  She reports her pain is improved.  Repeat abdominal exam shows improved tenderness.  She has been able to tolerate p.o. without any difficulty.  Patient states she is ready to go home.  Will give short course of pain medication and nausea medication.  Patient instructed to follow-up with general surgery as directed. At this time, patient exhibits no emergent life-threatening condition that require further evaluation in ED or admission. Patient had ample opportunity for questions and discussion. All patient's questions were answered with full understanding. Strict return precautions discussed. Patient expresses understanding and agreement to plan.   Portions of this note were generated with Scientist, clinical (histocompatibility and immunogenetics). Dictation errors may occur despite best  attempts at  proofreading.  Final Clinical Impression(s) / ED Diagnoses Final diagnoses:  Abdominal pain  Biliary colic  Calculus of gallbladder without cholecystitis without obstruction    Rx / DC Orders ED Discharge Orders         Ordered    HYDROcodone-acetaminophen (NORCO/VICODIN) 5-325 MG tablet  Every 6 hours PRN     12/22/19 2135    ondansetron (ZOFRAN ODT) 4 MG disintegrating tablet  Every 8 hours PRN     12/22/19 2135           Maxwell Caul, PA-C 12/22/19 2254    Terrilee Files, MD 12/23/19 [phone]

## 2019-12-22 NOTE — ED Notes (Signed)
Pt is aware she needs a urine sample 

## 2019-12-22 NOTE — ED Triage Notes (Addendum)
Pt c/o right abd pain, n/v-sx started today-pt [facility] ED due to wait-NAD-to triage in w/c

## 2019-12-22 NOTE — Discharge Instructions (Signed)
You can take Tylenol or Ibuprofen as directed for pain. You can alternate Tylenol and Ibuprofen every 4 hours. If you take Tylenol at 1pm, then you can take Ibuprofen at 5pm. Then you can take Tylenol again at 9pm.   Take pain medications as directed for break through pain. Do not drive or operate machinery while taking this medication.   Take zofran for nausea.   As we discussed, limit the amount of spicy, fatty, fried foods you are eating.  As we discussed, I have given you a referral to general surgery for evaluation.  You can discuss with them regarding further evaluation and possible removal of your gallbladder.  Return the emergency department for any worsening pain, fever, vomiting or any other worsening or concerning symptoms.

## 2019-12-22 NOTE — ED Notes (Signed)
Called patient x2 for blood work, no response.

## 2019-12-28 ENCOUNTER — Other Ambulatory Visit: Payer: Self-pay | Admitting: Surgery

## 2020-01-13 ENCOUNTER — Encounter (HOSPITAL_BASED_OUTPATIENT_CLINIC_OR_DEPARTMENT_OTHER): Payer: Self-pay | Admitting: Emergency Medicine

## 2020-01-13 ENCOUNTER — Other Ambulatory Visit: Payer: Self-pay

## 2020-01-13 ENCOUNTER — Emergency Department (HOSPITAL_BASED_OUTPATIENT_CLINIC_OR_DEPARTMENT_OTHER)
Admission: EM | Admit: 2020-01-13 | Discharge: 2020-01-13 | Disposition: A | Discharge status: Home / Self Care | Payer: Medicaid Other | Attending: Emergency Medicine | Admitting: Emergency Medicine

## 2020-01-13 DIAGNOSIS — N764 Abscess of vulva: Secondary | ICD-10-CM | POA: Diagnosis not present

## 2020-01-13 DIAGNOSIS — E119 Type 2 diabetes mellitus without complications: Secondary | ICD-10-CM | POA: Insufficient documentation

## 2020-01-13 DIAGNOSIS — L03011 Cellulitis of right finger: Secondary | ICD-10-CM | POA: Insufficient documentation

## 2020-01-13 DIAGNOSIS — L0291 Cutaneous abscess, unspecified: Secondary | ICD-10-CM | POA: Diagnosis present

## 2020-01-13 DIAGNOSIS — F1210 Cannabis abuse, uncomplicated: Secondary | ICD-10-CM | POA: Diagnosis not present

## 2020-01-13 DIAGNOSIS — I10 Essential (primary) hypertension: Secondary | ICD-10-CM | POA: Insufficient documentation

## 2020-01-13 DIAGNOSIS — F17210 Nicotine dependence, cigarettes, uncomplicated: Secondary | ICD-10-CM | POA: Insufficient documentation

## 2020-01-13 MED ORDER — DOXYCYCLINE HYCLATE 100 MG PO CAPS
100.0000 mg | ORAL_CAPSULE | Freq: Two times a day (BID) | ORAL | 0 refills | Status: AC
Start: 2020-01-13 — End: 2020-01-20

## 2020-01-13 MED ORDER — LIDOCAINE HCL (PF) 1 % IJ SOLN
5.0000 mL | Freq: Once | INTRAMUSCULAR | Status: AC
  Administered 2020-01-13: 5 mL
  Filled 2020-01-13: qty 5

## 2020-01-13 NOTE — ED Notes (Signed)
ED Provider at bedside. 

## 2020-01-13 NOTE — ED Triage Notes (Signed)
Pt reports vaginal abscess x 3-4 days with drainage. Pt also c/o abscess to right 3rd digit. Pt denies fever

## 2020-01-13 NOTE — ED Notes (Signed)
Pt to restroom, will provide urine sample

## 2020-01-13 NOTE — Discharge Instructions (Signed)
You were seen in the emergency department with swelling and pain in 2 separate areas.  The area in your groin appears to be draining and should improve with antibiotics.  I was able to drain the area on your finger.  Please keep a warm compress on the area and keep the area clean and dry.  You may experience some drainage and bleeding from this area over the next several days but this ultimately should resolve.  I have listed the name of an OB/GYN for follow-up.  Please return to the emergency department with any new or suddenly worsening symptoms.

## 2020-01-13 NOTE — ED Provider Notes (Signed)
Emergency Department Provider Note   I have reviewed the triage vital signs and the nursing notes.   HISTORY  Chief Complaint Abscess   HPI Kristin Wood is a 29 y.o. female with past medical history reviewed below presents to the emergency department with swelling and pain over the tip of the right middle finger and draining, painful area in the right labia region.  Symptoms have been progressively worsening over the past 3 to 4 days.  Patient had a paronychia treated in the emergency department last month along with a gluteal abscess.  This improved with antibiotics and drainage of the paronychia.  She states that the same thing is now happening to her right middle finger.  She also noted an area to the right labia which began draining today but remains somewhat firm and mildly uncomfortable.  She denies any fevers or shaking chills. No radiation of symptoms or modifying factors.   Past Medical History:  Diagnosis Date  . Chlamydia 05/2018  . Diabetes (HCC)   . H/O bipolar disorder   . Hypertension   . Morbid obesity (HCC)   . Sickle cell trait (HCC)   . Trichimoniasis     There are no problems to display for this patient.   Past Surgical History:  Procedure Laterality Date  . CESAREAN SECTION    . TUBAL LIGATION      Allergies Patient has no known allergies.  Family History  Problem Relation Age of Onset  . Thyroid cancer Mother   . Diabetes Mother   . Hypertension Mother   . Diabetes Maternal Grandmother   . Stroke Maternal Grandmother   . Stroke Maternal Grandfather   . Breast cancer Maternal Aunt   . Diabetes Maternal Aunt   . Diabetes Maternal Aunt     Social History Social History   Tobacco Use  . Smoking status: Current Every Day Smoker    Types: Cigarettes  . Smokeless tobacco: Never Used  Vaping Use  . Vaping Use: Never used  Substance Use Topics  . Alcohol use: Never  . Drug use: Yes    Types: Marijuana    Review of  Systems  Constitutional: No fever/chills Eyes: No visual changes. ENT: No sore throat. Cardiovascular: Denies chest pain. Respiratory: Denies shortness of breath. Gastrointestinal: No abdominal pain.  No nausea, no vomiting.  No diarrhea.  No constipation. Genitourinary: Negative for dysuria. Musculoskeletal: Negative for back pain. Skin: Pain and swelling of the right middle finger and right labia.  Neurological: Negative for headaches, focal weakness or numbness.  10-point ROS otherwise negative.  ____________________________________________   PHYSICAL EXAM:  VITAL SIGNS: ED Triage Vitals  Enc Vitals Group     BP 01/13/20 1700 (!) 175/101     Pulse Rate 01/13/20 1700 (!) 110     Resp 01/13/20 1700 20     Temp 01/13/20 1700 99.4 F (37.4 C)     Temp Source 01/13/20 1700 Oral     SpO2 01/13/20 1700 99 %     Weight 01/13/20 1655 (!) 356 lb (161.5 kg)     Height 01/13/20 1655 5\' 3"  (1.6 m)   Constitutional: Alert and oriented. Well appearing and in no acute distress. Eyes: Conjunctivae are normal.  Head: Atraumatic. Nose: No congestion/rhinnorhea. Neck: No stridor.   Cardiovascular: Tachycardia on arrival (resolved in exam room on my exam). Respiratory: Normal respiratory effort.  Gastrointestinal: No distention.  Genitourinary: Exam of the patient's labia performed after obtaining verbal consent and with nurse chaperone.  Patient has a an approximately 1 cm firm area in the right labia with active drainage. No surrounding skin breakdown or skin breakdown.  Musculoskeletal: No gross deformities of extremities. Neurologic:  Normal speech and language.  Skin: Paronychia for the right middle finger without felon or finger cellulitis. Labial abscess as above.   ____________________________________________   PROCEDURES  Procedure(s) performed:   Marland KitchenMarland KitchenIncision and Drainage  Date/Time: 01/13/2020 5:22 PM Performed by: Maia Plan, MD Authorized by: Maia Plan, MD    Consent:    Consent obtained:  Verbal   Consent given by:  Patient   Risks discussed:  Bleeding, damage to other organs, infection, pain and incomplete drainage Universal protocol:    Patient identity confirmed:  Verbally with patient Location:    Indications for incision and drainage: paronychia    Size:  0.25 cm   Location:  Upper extremity   Upper extremity location:  Finger   Finger location:  R Hridaan Bouse finger Pre-procedure details:    Skin preparation:  Betadine Anesthesia (see MAR for exact dosages):    Anesthesia method:  Nerve block   Block location:  Digital   Block needle gauge:  27 G   Block anesthetic:  Lidocaine 1% w/o epi   Block technique:  Needle advanced at base of finger. No blood on aspiration. Incremental injection with anesthesia achieved.    Block injection procedure:  Anatomic landmarks identified and anatomic landmarks palpated   Block outcome:  Anesthesia achieved Procedure type:    Complexity:  Simple Procedure details:    Needle aspiration: yes     Needle size:  18 G   Incision types:  Stab incision   Drainage:  Bloody and purulent   Drainage amount:  Scant   Wound treatment:  Wound left open   Packing materials:  None Post-procedure details:    Patient tolerance of procedure:  Tolerated well, no immediate complications     ____________________________________________   INITIAL IMPRESSION / ASSESSMENT AND PLAN / ED COURSE  Pertinent labs & imaging results that were available during my care of the patient were reviewed by me and considered in my medical decision making (see chart for details).   Patient presents to the emergency department with abscess in both her right labia which is draining and does not require incision and drainage along with a right middle finger paronychia.  The paronychia was treated as above after digital block.  Patient tolerated the procedure well without significant pain.  We will start the patient on doxycycline.   She is afebrile here.  She has mild tachycardia in triage but that has resolved by the time I examined her at bedside.  Do not suspect deeper space pelvic type infection or developing sepsis.  Discussed ED return precautions and gave follow-up information for OB/GYN.  Patient to call on Monday to schedule the next available appointment for routine follow-up.    ____________________________________________  FINAL CLINICAL IMPRESSION(S) / ED DIAGNOSES  Final diagnoses:  Paronychia of finger of right hand  Labial abscess     MEDICATIONS GIVEN DURING THIS VISIT:  Medications  lidocaine (PF) (XYLOCAINE) 1 % injection 5 mL (5 mLs Infiltration Given by Other 01/13/20 1658)     NEW OUTPATIENT MEDICATIONS STARTED DURING THIS VISIT:  Discharge Medication List as of 01/13/2020  5:18 PM    START taking these medications   Details  doxycycline (VIBRAMYCIN) 100 MG capsule Take 1 capsule (100 mg total) by mouth 2 (two) times daily for 7 days.,  Starting Thu 01/13/2020, Until Thu 01/20/2020, Normal        Note:  This document was prepared using Dragon voice recognition software and may include unintentional dictation errors.  Nanda Quinton, MD, [facility]    Stevana Dufner, W[facility], MD 01/13/20 1725

## 2020-05-03 ENCOUNTER — Emergency Department (HOSPITAL_BASED_OUTPATIENT_CLINIC_OR_DEPARTMENT_OTHER): Payer: Medicaid Other

## 2020-05-03 ENCOUNTER — Encounter (HOSPITAL_BASED_OUTPATIENT_CLINIC_OR_DEPARTMENT_OTHER): Payer: Self-pay | Admitting: Emergency Medicine

## 2020-05-03 ENCOUNTER — Other Ambulatory Visit: Payer: Self-pay

## 2020-05-03 ENCOUNTER — Emergency Department (HOSPITAL_BASED_OUTPATIENT_CLINIC_OR_DEPARTMENT_OTHER)
Admission: EM | Admit: 2020-05-03 | Discharge: 2020-05-03 | Disposition: A | Discharge status: Home / Self Care | Payer: Medicaid Other | Attending: Emergency Medicine | Admitting: Emergency Medicine

## 2020-05-03 DIAGNOSIS — I10 Essential (primary) hypertension: Secondary | ICD-10-CM | POA: Diagnosis not present

## 2020-05-03 DIAGNOSIS — Z7984 Long term (current) use of oral hypoglycemic drugs: Secondary | ICD-10-CM | POA: Diagnosis not present

## 2020-05-03 DIAGNOSIS — E119 Type 2 diabetes mellitus without complications: Secondary | ICD-10-CM | POA: Insufficient documentation

## 2020-05-03 DIAGNOSIS — F17210 Nicotine dependence, cigarettes, uncomplicated: Secondary | ICD-10-CM | POA: Diagnosis not present

## 2020-05-03 DIAGNOSIS — W228XXA Striking against or struck by other objects, initial encounter: Secondary | ICD-10-CM | POA: Diagnosis not present

## 2020-05-03 DIAGNOSIS — S90812A Abrasion, left foot, initial encounter: Secondary | ICD-10-CM | POA: Diagnosis present

## 2020-05-03 LAB — PREGNANCY, URINE: Preg Test, Ur: NEGATIVE

## 2020-05-03 MED ORDER — DOXYCYCLINE HYCLATE 100 MG PO CAPS
100.0000 mg | ORAL_CAPSULE | Freq: Two times a day (BID) | ORAL | 0 refills | Status: AC
Start: 2020-05-03 — End: 2020-05-10

## 2020-05-03 NOTE — ED Provider Notes (Signed)
[facility] EMERGENCY DEPARTMENT Provider Note   CSN: [id number] Arrival date & time: 05/03/20  1448     History Chief Complaint  Patient presents with  . Abrasion    Kristin Wood is a 29 y.o. female h/o DM, HTN who presents for evaluation of left foot injury that occurred 3 days ago.  She reports that 3 days ago, she was walking around the edge of her bed and states that a piece of metal from the bed frame struck her foot.  She is unsure exactly of how this occurred or where the exact injury was but states that she noticed an abrasion on the dorsal aspect of her left foot after that.  She reports that few days ago, it started draining some green/yellow discharge.  She states she also noticed that it was swollen.  She is still been able to ambulate on it.  She was wearing shoes at the time but states that she did not have a puncture wound from the plantar surface.  She does have a history of diabetes and takes Metformin.  Her sugars range between 150-170.  She is on any sugars above 250 recently.  Her tetanus is up-to-date.  She denies any fevers, numbness/weakness.  The history is provided by the patient.       Past Medical History:  Diagnosis Date  . Chlamydia 05/2018  . Diabetes (HCC)   . H/O bipolar disorder   . Hypertension   . Morbid obesity (HCC)   . Sickle cell trait (HCC)   . Trichimoniasis     There are no problems to display for this patient.   Past Surgical History:  Procedure Laterality Date  . CESAREAN SECTION    . TUBAL LIGATION       OB History    Gravida  3   Para  3   Term  3   Preterm      AB      Living        SAB      TAB      Ectopic      Multiple      Live Births              Family History  Problem Relation Age of Onset  . Thyroid cancer Mother   . Diabetes Mother   . Hypertension Mother   . Diabetes Maternal Grandmother   . Stroke Maternal Grandmother   . Stroke Maternal Grandfather   . Breast cancer  Maternal Aunt   . Diabetes Maternal Aunt   . Diabetes Maternal Aunt     Social History   Tobacco Use  . Smoking status: Current Every Day Smoker    Types: Cigarettes  . Smokeless tobacco: Never Used  Vaping Use  . Vaping Use: Never used  Substance Use Topics  . Alcohol use: Never  . Drug use: Yes    Types: Marijuana    Home Medications Prior to Admission medications   Medication Sig Start Date End Date Taking? Authorizing Provider  doxycycline (VIBRAMYCIN) 100 MG capsule Take 1 capsule (100 mg total) by mouth 2 (two) times daily for 7 days. 05/03/20 05/10/20  Maxwell Caul, PA-C  HYDROcodone-acetaminophen (NORCO/VICODIN) 5-325 MG tablet Take 1-2 tablets by mouth every 6 (six) hours as needed. 12/22/19   Maxwell Caul, PA-C  metFORMIN (GLUCOPHAGE) 500 MG tablet Take 1 tablet (500 mg total) by mouth 2 (two) times daily with a meal. 07/11/19   Horton, Toni Amend  F, MD  ondansetron (ZOFRAN ODT) 4 MG disintegrating tablet Take 1 tablet (4 mg total) by mouth every 8 (eight) hours as needed for nausea or vomiting. 12/22/19   Maxwell Caul, PA-C    Allergies    Patient has no known allergies.  Review of Systems   Review of Systems  Constitutional: Negative for fever.  Musculoskeletal:       Foot swelling  Skin: Positive for color change and wound.  Neurological: Negative for weakness and numbness.  All other systems reviewed and are negative.   Physical Exam Updated Vital Signs BP (!) 150/115   Pulse 91   Temp 98.8 F (37.1 C) (Oral)   Resp 18   Ht 5\' 3"  (1.6 m)   Wt (!) 157.4 kg   LMP 04/26/2020   SpO2 97%   BMI 61.47 kg/m   Physical Exam Vitals and nursing note reviewed.  Constitutional:      Appearance: She is well-developed.  HENT:     Head: Normocephalic and atraumatic.  Eyes:     General: No scleral icterus.       Right eye: No discharge.        Left eye: No discharge.     Conjunctiva/sclera: Conjunctivae normal.  Cardiovascular:     Pulses:           Dorsalis pedis pulses are 2+ on the right side and 2+ on the left side.  Pulmonary:     Effort: Pulmonary effort is normal.  Musculoskeletal:       Feet:     Comments: Tenderness palpation noted to the dorsal aspect of the left foot.  She has about a 3 cm superficial abrasion that is scabbed over with some mild yellow drainage noted to the dorsal aspect of the foot just distal to the actual ankle joint.  Flexion/tension of left ankle intact without any difficulty.  She can wiggle all 5 toes without any difficulty.  Do not see any entrance wound over the actual joint itself.  Feet:     Comments: 3 cm superficial abrasion that is scabbed over some mild yellow drainage. Skin:    General: Skin is warm and dry.     Comments: Good distal cap refill.  LLE is not dusky in appearance or cool to touch.  Neurological:     Mental Status: She is alert.     Comments: Sensation intact along major nerve distributions of BLE  Psychiatric:        Speech: Speech normal.        Behavior: Behavior normal.     ED Results / Procedures / Treatments   Labs (all labs ordered are listed, but only abnormal results are displayed) Labs Reviewed  PREGNANCY, URINE    EKG None  Radiology DG Foot Complete Left  Result Date: 05/03/2020 CLINICAL DATA:  Left foot laceration. EXAM: LEFT FOOT - COMPLETE 3+ VIEW COMPARISON:  None. FINDINGS: There is no evidence of fracture or dislocation. There is no evidence of arthropathy or other focal bone abnormality. Soft tissues are unremarkable. No radiopaque foreign body is noted. No lytic destruction is noted. IMPRESSION: Negative. Electronically Signed   By: 05/05/2020 M.D.   On: 05/03/2020 10:14    Procedures Procedures (including critical care time)  Medications Ordered in ED Medications - No data to display  ED Course  I have reviewed the triage vital signs and the nursing notes.  Pertinent labs & imaging results that were available during my care  of the  patient were reviewed by me and considered in my medical decision making (see chart for details).    MDM Rules/Calculators/A&P                          29 year old female with past history of diabetes (currently on Metformin) who presents for evaluation of wound to left foot x3 days.  Reports some increased swelling and drainage from the wound site.  No fevers.  She has been able to ambulate.  On initially arrival, she is afebrile, nontoxic-appearing.  Vital signs are stable.  On exam, she has a 3 cm superficial abrasion to the dorsal aspect of the foot just distal to the actual ankle joint.  There is some mild yellow scabbing noted.  She is neurovascularly intact.  She is unsure of exactly where the entrance wound was or where she was hit.  I do not see any other obvious entrance wound but will plan for x-ray to evaluate for any acute bony abnormality.  Given her history of diabetes and drainage from the wound, will plan for antibiotics.  Patient with no known drug allergies.  Foot XR shows no acute abnormality. No evidence of bony abnormality. Urine pregnancy negative.   Discussed results with patient.  We will put patient on antibiotics.  Patient with no known drug allergies.  Encouraged at home supportive care measures. At this time, patient exhibits no emergent life-threatening condition that require further evaluation in ED. Patient had ample opportunity for questions and discussion. All patient's questions were answered with full understanding. Strict return precautions discussed. Patient expresses understanding and agreement to plan.   Portions of this note were generated with Scientist, clinical (histocompatibility and immunogenetics). Dictation errors may occur despite best attempts at proofreading.  Final Clinical Impression(s) / ED Diagnoses Final diagnoses:  Abrasion of left foot, initial encounter    Rx / DC Orders ED Discharge Orders         Ordered    doxycycline (VIBRAMYCIN) 100 MG capsule  2 times daily          05/03/20 1022           Rosana Hoes 05/03/20 1144    Milagros Loll, MD 05/04/20 [id number]

## 2020-05-03 NOTE — Discharge Instructions (Signed)
Gently clean the wound with soap and water. Make sure to pat dry the wound before covering it with any dressing. You can use topical antibiotic ointment and bandage. Ice and elevate for pain relief.   You can take Tylenol or Ibuprofen as directed for pain. You can alternate Tylenol and Ibuprofen every 4 hours for additional pain relief.   Take antibiotics as directed. Please take all of your antibiotics until finished.  Monitor closely for any signs of infection. Return to the Emergency Department for any worsening redness/swelling of the area that begins to spread, drainage from the site, worsening pain, fever or any other worsening or concerning symptoms.   

## 2020-05-03 NOTE — ED Triage Notes (Signed)
Foot /skin abrasion , 5 days ago. No signs of infection

## 2021-04-21 IMAGING — US US ABDOMEN LIMITED
1 series · 14 of 25 positions shown · non-contrast
Comparison: None.

CLINICAL DATA: Right upper quadrant pain with nausea and vomiting.

EXAM:
ULTRASOUND ABDOMEN LIMITED RIGHT UPPER QUADRANT

[Series 1: us abdomen limited · 14 of 50 slices shown]
[im 1/50]
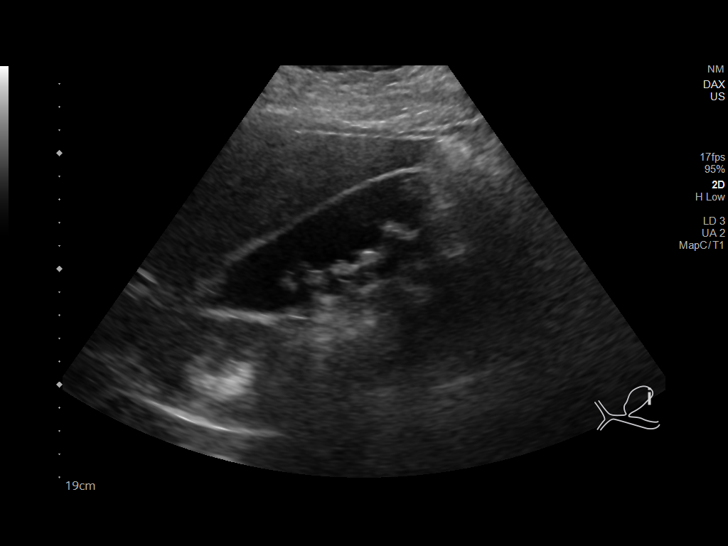
[im 5/50]
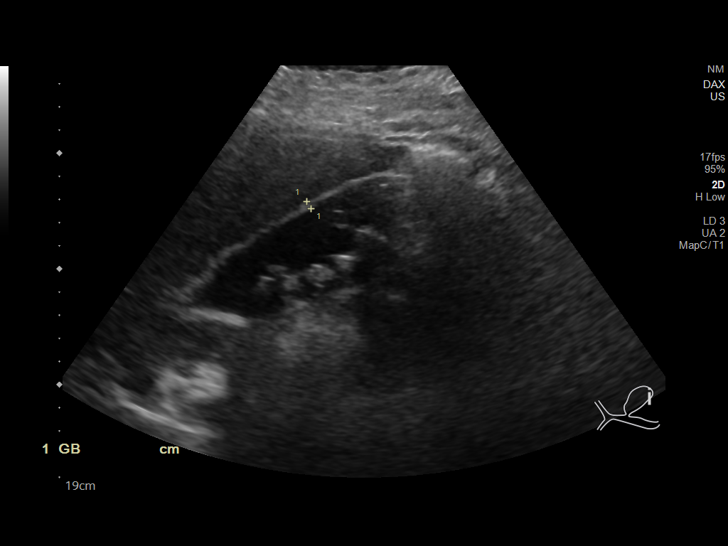
[im 9/50]
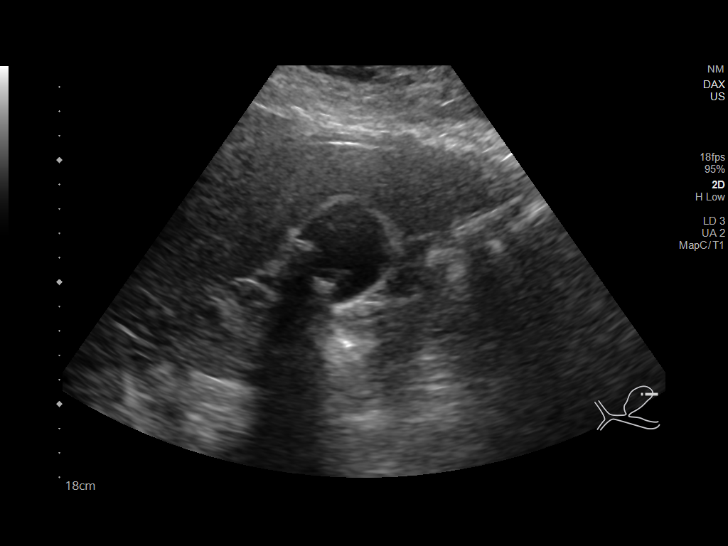
[im 13/50]
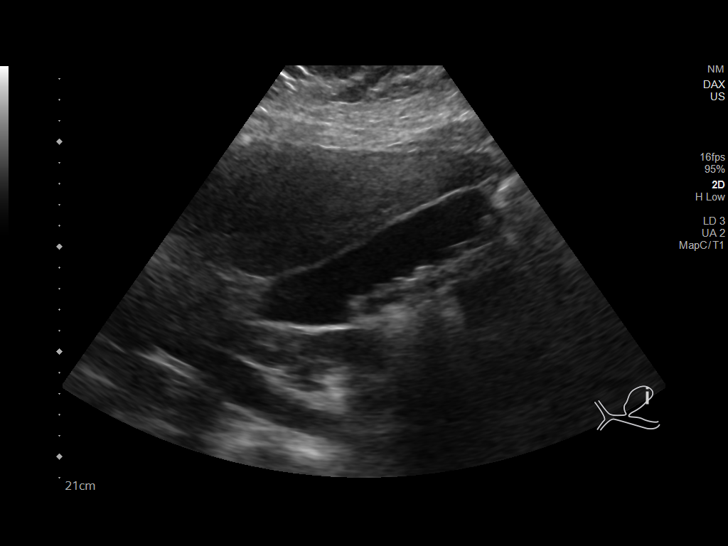
[im 17/50]
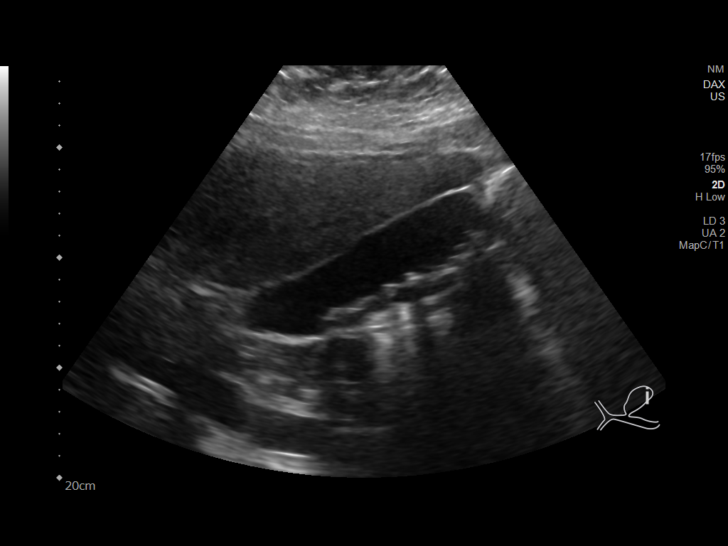
[im 19/50]
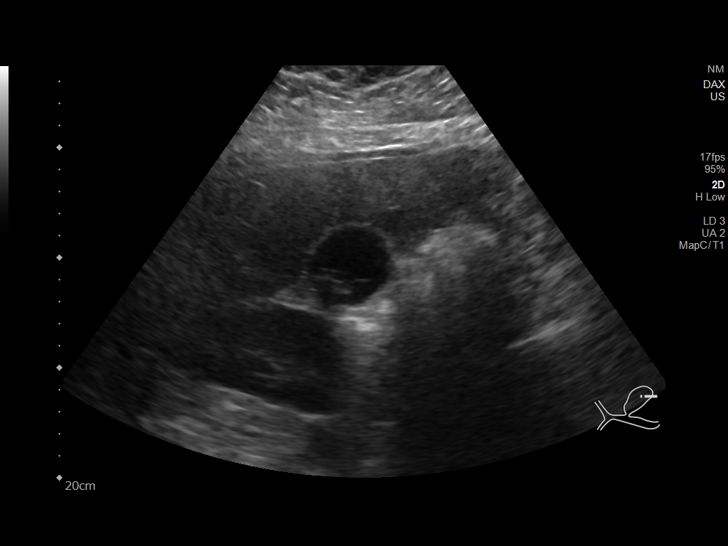
[im 23/50]
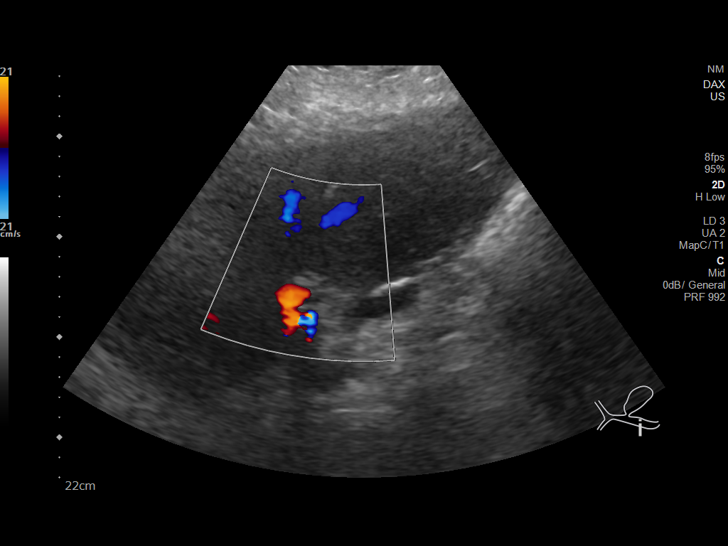
[im 27/50]
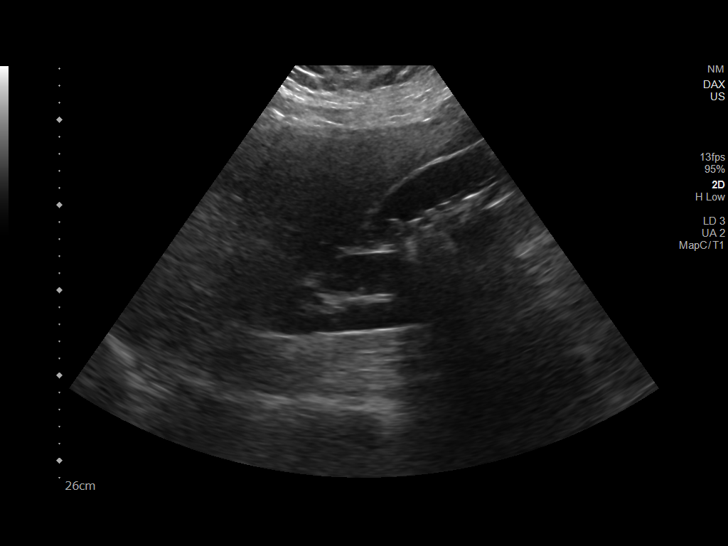
[im 31/50]
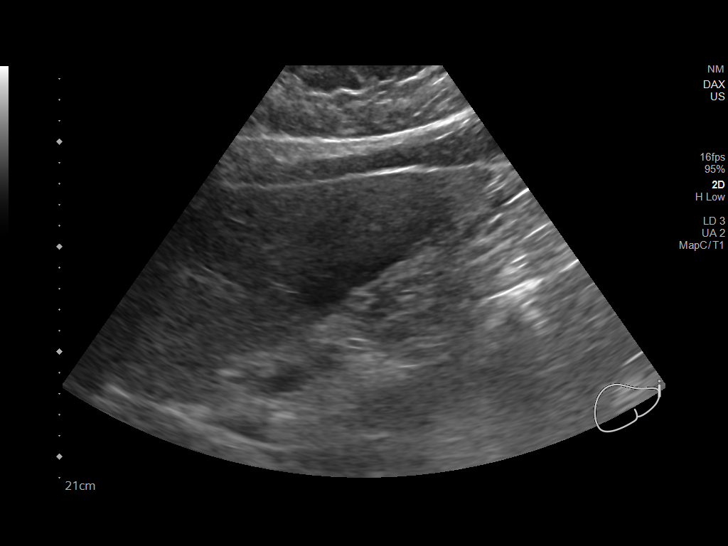
[im 33/50]
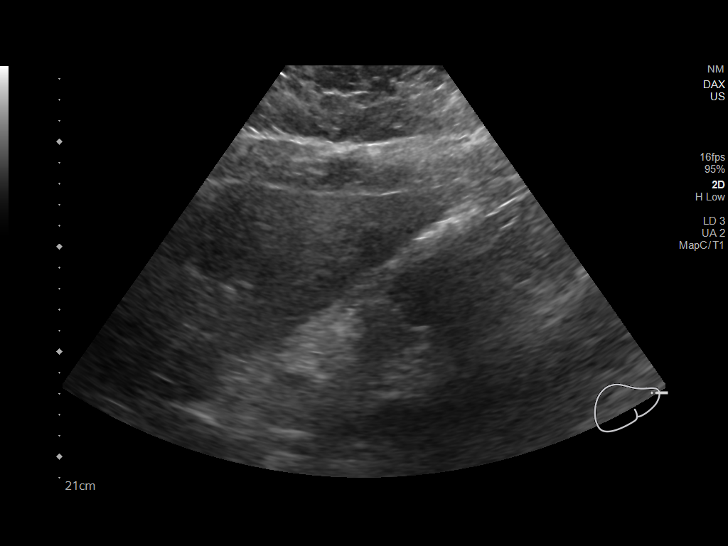
[im 37/50]
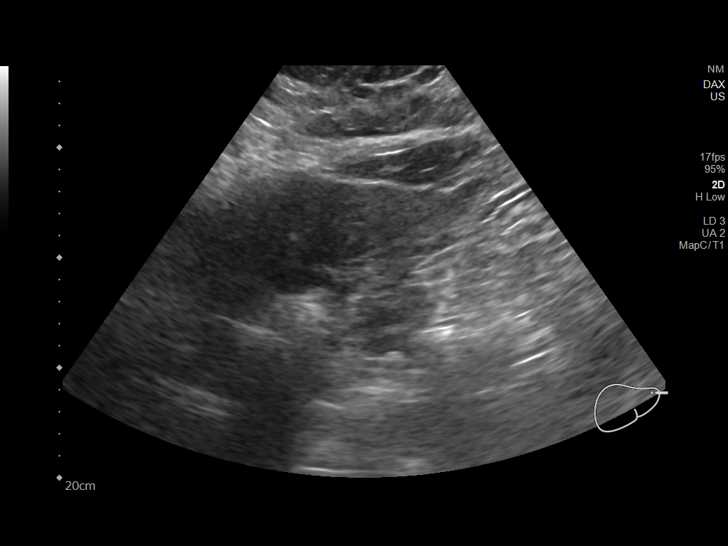
[im 41/50]
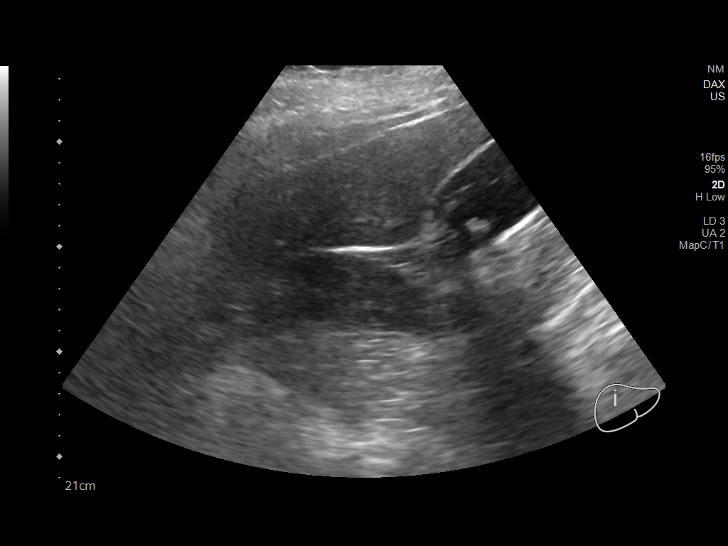
[im 45/50]
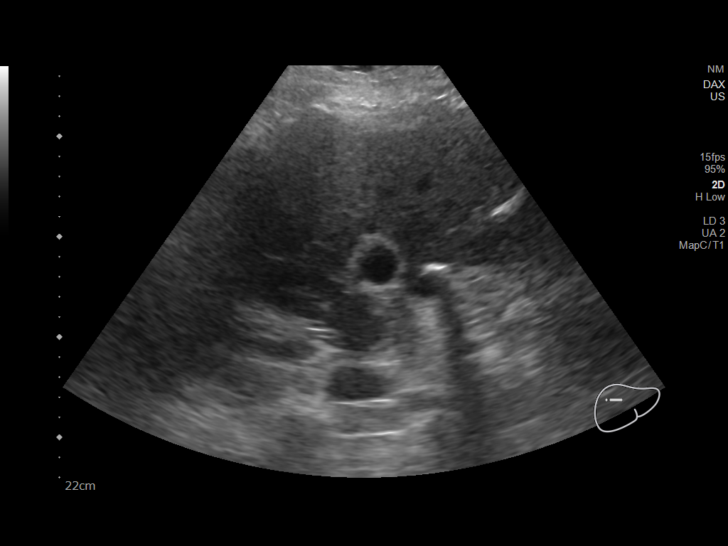
[im 50/50]
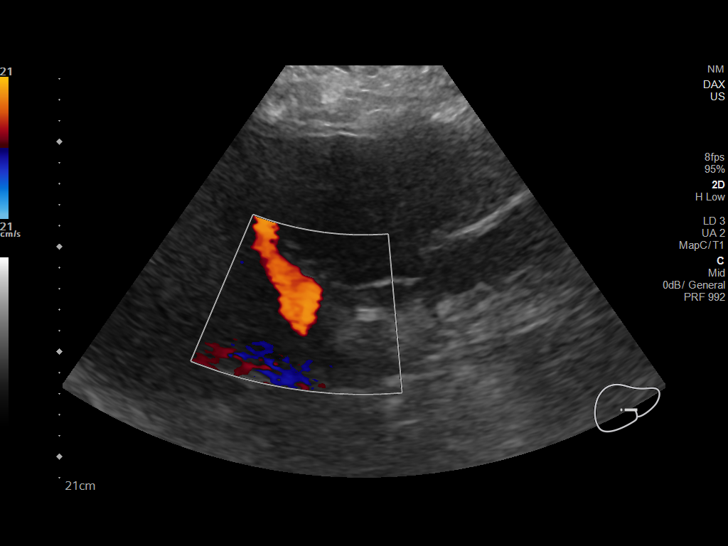

[14 of 25 positions shown; findings below may reference images not displayed]

FINDINGS: Gallbladder:

Multiple echogenic gallstones are seen (the largest measures
cm). There is no evidence of gallbladder wall thickening (3.6 mm).
No sonographic Murphy sign noted by sonographer.

Common bile duct:

Diameter: 2.8 mm

Liver:

No focal lesion identified. There is diffusely increased
echogenicity of the liver parenchyma. Portal vein is patent on color
Doppler imaging with normal direction of blood flow towards the
liver.

Other: It should be noted that the study is limited secondary to the
patient's body habitus.
IMPRESSION: 1. Cholelithiasis without evidence of acute cholecystitis.
2. Fatty liver.

## 2021-05-13 ENCOUNTER — Emergency Department (HOSPITAL_BASED_OUTPATIENT_CLINIC_OR_DEPARTMENT_OTHER): Payer: Medicaid Other

## 2021-05-13 ENCOUNTER — Encounter (HOSPITAL_BASED_OUTPATIENT_CLINIC_OR_DEPARTMENT_OTHER): Payer: Self-pay | Admitting: *Deleted

## 2021-05-13 ENCOUNTER — Emergency Department (HOSPITAL_BASED_OUTPATIENT_CLINIC_OR_DEPARTMENT_OTHER)
Admission: EM | Admit: 2021-05-13 | Discharge: 2021-05-13 | Disposition: A | Discharge status: Home / Self Care | Payer: Medicaid Other | Attending: Emergency Medicine | Admitting: Emergency Medicine

## 2021-05-13 DIAGNOSIS — F17210 Nicotine dependence, cigarettes, uncomplicated: Secondary | ICD-10-CM | POA: Diagnosis not present

## 2021-05-13 DIAGNOSIS — Z7984 Long term (current) use of oral hypoglycemic drugs: Secondary | ICD-10-CM | POA: Diagnosis not present

## 2021-05-13 DIAGNOSIS — I10 Essential (primary) hypertension: Secondary | ICD-10-CM | POA: Insufficient documentation

## 2021-05-13 DIAGNOSIS — R1011 Right upper quadrant pain: Secondary | ICD-10-CM | POA: Insufficient documentation

## 2021-05-13 DIAGNOSIS — E119 Type 2 diabetes mellitus without complications: Secondary | ICD-10-CM | POA: Insufficient documentation

## 2021-05-13 DIAGNOSIS — R112 Nausea with vomiting, unspecified: Secondary | ICD-10-CM | POA: Insufficient documentation

## 2021-05-13 LAB — URINALYSIS, ROUTINE W REFLEX MICROSCOPIC
Bilirubin Urine: NEGATIVE
Glucose, UA: 100 mg/dL — AB
Leukocytes,Ua: NEGATIVE
Nitrite: NEGATIVE
Protein, ur: NEGATIVE mg/dL
Specific Gravity, Urine: 1.02 (ref 1.005–1.030)
pH: 7.5 (ref 5.0–8.0)

## 2021-05-13 LAB — COMPREHENSIVE METABOLIC PANEL
ALT: 21 U/L (ref 0–44)
AST: 15 U/L (ref 15–41)
Albumin: 4.2 g/dL (ref 3.5–5.0)
Alkaline Phosphatase: 83 U/L (ref 38–126)
Anion gap: 7 (ref 5–15)
BUN: 10 mg/dL (ref 6–20)
CO2: 27 mmol/L (ref 22–32)
Calcium: 9.1 mg/dL (ref 8.9–10.3)
Chloride: 105 mmol/L (ref 98–111)
Creatinine, Ser: 0.7 mg/dL (ref 0.44–1.00)
GFR, Estimated: >60 mL/min (ref >60)
Glucose, Bld: 254 mg/dL — ABNORMAL HIGH (ref 70–99)
Potassium: 4.2 mmol/L (ref 3.5–5.1)
Sodium: 139 mmol/L (ref 135–145)
Total Bilirubin: 0.2 mg/dL — ABNORMAL LOW (ref 0.3–1.2)
Total Protein: 7.4 g/dL (ref 6.5–8.1)

## 2021-05-13 LAB — LIPASE, BLOOD: Lipase: 25 U/L (ref 11–51)

## 2021-05-13 LAB — CBC WITH DIFFERENTIAL/PLATELET
Abs Immature Granulocytes: 0.04 10*3/uL (ref 0.00–0.07)
Basophils Absolute: 0 10*3/uL (ref 0.0–0.1)
Basophils Relative: 0 %
Eosinophils Absolute: 0 10*3/uL (ref 0.0–0.5)
Eosinophils Relative: 0 %
HCT: 39.2 % (ref 36.0–46.0)
Hemoglobin: 12.9 g/dL (ref 12.0–15.0)
Immature Granulocytes: 0 %
Lymphocytes Relative: 15 %
Lymphs Abs: 1.5 10*3/uL (ref 0.7–4.0)
MCH: 25.6 pg — ABNORMAL LOW (ref 26.0–34.0)
MCHC: 32.9 g/dL (ref 30.0–36.0)
MCV: 77.9 fL — ABNORMAL LOW (ref 80.0–100.0)
Monocytes Absolute: 0.4 10*3/uL (ref 0.1–1.0)
Monocytes Relative: 4 %
Neutro Abs: 7.9 10*3/uL — ABNORMAL HIGH (ref 1.7–7.7)
Neutrophils Relative %: 81 %
Platelets: 253 10*3/uL (ref 150–400)
RBC: 5.03 MIL/uL (ref 3.87–5.11)
RDW: 13.6 % (ref 11.5–15.5)
WBC: 9.8 10*3/uL (ref 4.0–10.5)
nRBC: 0 % (ref 0.0–0.2)

## 2021-05-13 LAB — URINALYSIS, MICROSCOPIC (REFLEX)

## 2021-05-13 LAB — PREGNANCY, URINE: Preg Test, Ur: NEGATIVE

## 2021-05-13 MED ORDER — MORPHINE SULFATE (PF) 4 MG/ML IV SOLN
4.0000 mg | Freq: Once | INTRAVENOUS | Status: AC
  Administered 2021-05-13: 4 mg via INTRAVENOUS
  Filled 2021-05-13: qty 1

## 2021-05-13 MED ORDER — OXYCODONE-ACETAMINOPHEN 5-325 MG PO TABS: 1.0000 | ORAL_TABLET | Freq: Four times a day (QID) | ORAL | 0 refills | Status: AC | PRN

## 2021-05-13 MED ORDER — ONDANSETRON HCL 4 MG/2ML IJ SOLN
4.0000 mg | Freq: Once | INTRAMUSCULAR | Status: AC
  Administered 2021-05-13: 4 mg via INTRAVENOUS
  Filled 2021-05-13: qty 2

## 2021-05-13 MED ORDER — IOHEXOL 300 MG/ML  SOLN
100.0000 mL | Freq: Once | INTRAMUSCULAR | Status: AC | PRN
  Administered 2021-05-13: 100 mL via INTRAVENOUS

## 2021-05-13 MED ORDER — OXYCODONE-ACETAMINOPHEN 5-325 MG PO TABS: 1.0000 | ORAL_TABLET | Freq: Four times a day (QID) | ORAL | 0 refills | Status: DC | PRN

## 2021-05-13 MED ORDER — ONDANSETRON 4 MG PO TBDP: 4.0000 mg | ORAL_TABLET | Freq: Three times a day (TID) | ORAL | 0 refills | Status: AC | PRN

## 2021-05-13 NOTE — Discharge Instructions (Addendum)
You were seen and evaluated in the emergency department for for evaluation of right upper quadrant abdominal pain.  As we discussed, gallbladder does not need to be emergently removed at this time.  Your symptoms are likely due to your gallbladder however and I would recommend getting established with a primary care doctor for possible general surgery referral to talk about may be removing the gallbladder.  Please take Tylenol/ibuprofen scheduled for pain control.  I will also give you a prescription for Percocet for breakthrough pain.  Zofran for nausea and vomiting.  Please return to the emergency department if you experience worsening pain, intractable vomiting, fever, or any other concerns you might have.  Please follow-up with [facility].  Primary care service within the next week to establish care and possible surgical referral.

## 2021-05-13 NOTE — ED Provider Notes (Signed)
[facility] EMERGENCY DEPARTMENT Provider Note   CSN: [id number] Arrival date & time: 05/13/21  5427     History Chief Complaint  Patient presents with   Abdominal Pain    Kristin Wood is a 30 y.o. female with history of cholelithiasis who presents to the emergency department with right upper quadrant constant sharp abdominal pain over the last 8 hours.  She states the pain woke her up from sleep.  She had not shows with nacho cheese last night for dinner.  She reports associated nausea and vomiting but denies any fever, chills, urinary complaints, vaginal symptoms, chest pain, shortness of breath, loss of consciousness, diarrhea.  The history is provided by the patient. No language interpreter was used.  Abdominal Pain Associated symptoms: no dysuria, no hematuria, no vaginal bleeding and no vaginal discharge       Past Medical History:  Diagnosis Date   Chlamydia 05/2018   Diabetes (HCC)    H/O bipolar disorder    Hypertension    Morbid obesity (HCC)    Sickle cell trait (HCC)    Trichimoniasis     There are no problems to display for this patient.   Past Surgical History:  Procedure Laterality Date   CESAREAN SECTION     TUBAL LIGATION       OB History     Gravida  3   Para  3   Term  3   Preterm      AB      Living         SAB      IAB      Ectopic      Multiple      Live Births              Family History  Problem Relation Age of Onset   Thyroid cancer Mother    Diabetes Mother    Hypertension Mother    Diabetes Maternal Grandmother    Stroke Maternal Grandmother    Stroke Maternal Grandfather    Breast cancer Maternal Aunt    Diabetes Maternal Aunt    Diabetes Maternal Aunt     Social History   Tobacco Use   Smoking status: Every Day    Types: Cigarettes   Smokeless tobacco: Never  Vaping Use   Vaping Use: Never used  Substance Use Topics   Alcohol use: Never   Drug use: Yes    Types: Marijuana     Home Medications Prior to Admission medications   Medication Sig Start Date End Date Taking? Authorizing Provider  ondansetron (ZOFRAN ODT) 4 MG disintegrating tablet Take 1 tablet (4 mg total) by mouth every 8 (eight) hours as needed for nausea or vomiting. 05/13/21  Yes Meredeth Ide, Madaline Lefeber M, PA-C  metFORMIN (GLUCOPHAGE) 500 MG tablet Take 1 tablet (500 mg total) by mouth 2 (two) times daily with a meal. 07/11/19   Horton, Mayer Masker, MD  oxyCODONE-acetaminophen (PERCOCET/ROXICET) 5-325 MG tablet Take 1 tablet by mouth every 6 (six) hours as needed for severe pain. 05/13/21   Teressa Lower, PA-C    Allergies    Patient has no known allergies.  Review of Systems   Review of Systems  Gastrointestinal:  Positive for abdominal pain.  Genitourinary:  Negative for dysuria, frequency, hematuria, urgency, vaginal bleeding and vaginal discharge.  All other systems reviewed and are negative.  Physical Exam Updated Vital Signs BP (!) 162/99   Pulse 84   Temp 98.8 F (37.1  C) (Oral)   Resp 16   Ht 5\' 2"  (1.575 m)   Wt (!) 169.6 kg   SpO2 98%   BMI 68.41 kg/m   Physical Exam Constitutional:      General: She is not in acute distress.    Appearance: Normal appearance.  HENT:     Head: Normocephalic and atraumatic.  Eyes:     General:        Right eye: No discharge.        Left eye: No discharge.  Cardiovascular:     Comments: Regular rate and rhythm.  S1/S2 are distinct without any evidence of murmur, rubs, or gallops.  Radial pulses are 2+ bilaterally.  Dorsalis pedis pulses are 2+ bilaterally.  No evidence of pedal edema. Pulmonary:     Comments: Clear to auscultation bilaterally.  Normal effort.  No respiratory distress.  No evidence of wheezes, rales, or rhonchi heard throughout. Abdominal:     Comments: Obese abdomen.  No surgical scars.  Bowel sounds are normal and heard throughout all 4 quadrants.  There is moderate tenderness in the right upper quadrant with light  palpation.  The rest of the abdomen is nontender.  No obvious peritoneal signs at this time.  No guarding or rebound.  Musculoskeletal:        General: Normal range of motion.     Cervical back: Neck supple.  Skin:    General: Skin is warm and dry.     Findings: No rash.  Neurological:     General: No focal deficit present.     Mental Status: She is alert.  Psychiatric:        Mood and Affect: Mood normal.        Behavior: Behavior normal.    ED Results / Procedures / Treatments   Labs (all labs ordered are listed, but only abnormal results are displayed) Labs Reviewed  CBC WITH DIFFERENTIAL/PLATELET - Abnormal; Notable for the following components:      Result Value   MCV 77.9 (*)    MCH 25.6 (*)    Neutro Abs 7.9 (*)    All other components within normal limits  COMPREHENSIVE METABOLIC PANEL - Abnormal; Notable for the following components:   Glucose, Bld 254 (*)    Total Bilirubin 0.2 (*)    All other components within normal limits  URINALYSIS, ROUTINE W REFLEX MICROSCOPIC - Abnormal; Notable for the following components:   APPearance HAZY (*)    Glucose, UA 100 (*)    Hgb urine dipstick MODERATE (*)    Ketones, ur TRACE (*)    All other components within normal limits  URINALYSIS, MICROSCOPIC (REFLEX) - Abnormal; Notable for the following components:   Bacteria, UA MANY (*)    All other components within normal limits  LIPASE, BLOOD  PREGNANCY, URINE    EKG None  Radiology CT ABDOMEN PELVIS W CONTRAST  Result Date: 05/13/2021 CLINICAL DATA:  Right upper quadrant abdomen pain. History of gallstones. EXAM: CT ABDOMEN AND PELVIS WITH CONTRAST TECHNIQUE: Multidetector CT imaging of the abdomen and pelvis was performed using the standard protocol following bolus administration of intravenous contrast. CONTRAST:  07/13/2021 OMNIPAQUE IOHEXOL 300 MG/ML  SOLN COMPARISON:  None. FINDINGS: Lower chest: No acute abnormality. Hepatobiliary: No focal liver lesions identified. The  liver is otherwise unremarkable. There is possible sludge in the gallbladder. There is no inflammation around the gallbladder. The biliary tree is normal. Pancreas: Unremarkable. No pancreatic ductal dilatation or surrounding inflammatory changes. Spleen:  Normal in size without focal abnormality. Adrenals/Urinary Tract: Adrenal glands are unremarkable. Kidneys are normal, without renal calculi, focal lesion, or hydronephrosis. Bladder is unremarkable. Stomach/Bowel: Stomach is within normal limits. Appendix appears normal. No evidence of bowel wall thickening, distention, or inflammatory changes. Vascular/Lymphatic: No significant vascular findings are present. No enlarged abdominal or pelvic lymph nodes. Reproductive: Uterus and bilateral adnexa are unremarkable. Other: Small umbilical herniation of mesenteric fat noted. Musculoskeletal: Scoliosis of spine is noted. IMPRESSION: 1. No acute abnormality identified in the abdomen and pelvis. 2. Possible sludge in the gallbladder. There is no inflammation around the gallbladder. Electronically Signed   By: Sherian Rein M.D.   On: 05/13/2021 13:39    Procedures Procedures   Medications Ordered in ED Medications  ondansetron (ZOFRAN) injection 4 mg (4 mg Intravenous Given 05/13/21 1017)  morphine 4 MG/ML injection 4 mg (4 mg Intravenous Given 05/13/21 1038)  morphine 4 MG/ML injection 4 mg (4 mg Intravenous Given 05/13/21 1148)  ondansetron (ZOFRAN) injection 4 mg (4 mg Intravenous Given 05/13/21 1244)  iohexol (OMNIPAQUE) 300 MG/ML solution 100 mL (100 mLs Intravenous Contrast Given 05/13/21 1321)    ED Course  I have reviewed the triage vital signs and the nursing notes.  Pertinent labs & imaging results that were available during my care of the patient were reviewed by me and considered in my medical decision making (see chart for details).    MDM Rules/Calculators/A&P                          Kristin Wood is a 30 y.o. female who presents to the  emergency department for further evaluation of right upper quadrant abdominal pain.  Abdominal exam was without peritoneal signs.  No evidence of acute abdomen at this time.  She is overall well-appearing and much improved with morphine for pain control.  Given the CT findings there is appear to be any acute cholecystitis or cholangitis at this time.  Her low suspicion for pancreatitis.  Negative lipase.  Appendicitis is less likely as she is not having any pain or was her comments on the CT abdomen.  Her presentation is not consistent with any other acute findings at this time.  I will have her follow-up with primary care for possible surgical evaluation to get the gallbladder removed.  I gave her Percocet for breakthrough pain.  Tylenol and ibuprofen for regular pain.  Zofran for nausea and vomiting.  She is hemodynamically stable and safe for discharge.   Final Clinical Impression(s) / ED Diagnoses Final diagnoses:  Right upper quadrant abdominal pain    Rx / DC Orders ED Discharge Orders          Ordered    ondansetron (ZOFRAN ODT) 4 MG disintegrating tablet  Every 8 hours PRN        05/13/21 1353    oxyCODONE-acetaminophen (PERCOCET/ROXICET) 5-325 MG tablet  Every 6 hours PRN,   Status:  Discontinued        05/13/21 1358    oxyCODONE-acetaminophen (PERCOCET/ROXICET) 5-325 MG tablet  Every 6 hours PRN        05/13/21 1359             Honor Loh Rutledge, PA-C 05/13/21 1402    Alvira Monday, MD 05/13/21 2311

## 2021-05-13 NOTE — ED Triage Notes (Signed)
Presents to the ED via [facility], with c/o rt sided abd pain, N/V, onset at 2am, hx of Diverticulitis. Ate dinner last night of Nacho[employer] and vomited, rated abd pain with EMS as an 8 on 0-10 scale

## 2022-05-11 ENCOUNTER — Encounter (HOSPITAL_BASED_OUTPATIENT_CLINIC_OR_DEPARTMENT_OTHER): Payer: Self-pay | Admitting: Emergency Medicine

## 2022-05-11 ENCOUNTER — Emergency Department (HOSPITAL_BASED_OUTPATIENT_CLINIC_OR_DEPARTMENT_OTHER)
Admission: EM | Admit: 2022-05-11 | Discharge: 2022-05-11 | Disposition: A | Discharge status: Home / Self Care | Payer: Medicaid Other | Attending: Emergency Medicine | Admitting: Emergency Medicine

## 2022-05-11 DIAGNOSIS — E119 Type 2 diabetes mellitus without complications: Secondary | ICD-10-CM | POA: Insufficient documentation

## 2022-05-11 DIAGNOSIS — N760 Acute vaginitis: Secondary | ICD-10-CM | POA: Insufficient documentation

## 2022-05-11 DIAGNOSIS — Z202 Contact with and (suspected) exposure to infections with a predominantly sexual mode of transmission: Secondary | ICD-10-CM | POA: Insufficient documentation

## 2022-05-11 DIAGNOSIS — B9689 Other specified bacterial agents as the cause of diseases classified elsewhere: Secondary | ICD-10-CM

## 2022-05-11 DIAGNOSIS — Z7984 Long term (current) use of oral hypoglycemic drugs: Secondary | ICD-10-CM | POA: Insufficient documentation

## 2022-05-11 LAB — CBC
HCT: 38.2 % (ref 36.0–46.0)
Hemoglobin: 12.5 g/dL (ref 12.0–15.0)
MCH: 25 pg — ABNORMAL LOW (ref 26.0–34.0)
MCHC: 32.7 g/dL (ref 30.0–36.0)
MCV: 76.2 fL — ABNORMAL LOW (ref 80.0–100.0)
Platelets: 234 10*3/uL (ref 150–400)
RBC: 5.01 MIL/uL (ref 3.87–5.11)
RDW: 14 % (ref 11.5–15.5)
WBC: 8.2 10*3/uL (ref 4.0–10.5)
nRBC: 0 % (ref 0.0–0.2)

## 2022-05-11 LAB — COMPREHENSIVE METABOLIC PANEL
ALT: 17 U/L (ref 0–44)
AST: 17 U/L (ref 15–41)
Albumin: 3.8 g/dL (ref 3.5–5.0)
Alkaline Phosphatase: 89 U/L (ref 38–126)
Anion gap: 9 (ref 5–15)
BUN: 6 mg/dL (ref 6–20)
CO2: 26 mmol/L (ref 22–32)
Calcium: 8.8 mg/dL — ABNORMAL LOW (ref 8.9–10.3)
Chloride: 106 mmol/L (ref 98–111)
Creatinine, Ser: 0.61 mg/dL (ref 0.44–1.00)
GFR, Estimated: >60 mL/min (ref >60)
Glucose, Bld: 136 mg/dL — ABNORMAL HIGH (ref 70–99)
Potassium: 4.1 mmol/L (ref 3.5–5.1)
Sodium: 141 mmol/L (ref 135–145)
Total Bilirubin: 0.4 mg/dL (ref 0.3–1.2)
Total Protein: 7 g/dL (ref 6.5–8.1)

## 2022-05-11 LAB — URINALYSIS, ROUTINE W REFLEX MICROSCOPIC
Bilirubin Urine: NEGATIVE
Glucose, UA: NEGATIVE mg/dL
Hgb urine dipstick: NEGATIVE
Ketones, ur: NEGATIVE mg/dL
Leukocytes,Ua: NEGATIVE
Nitrite: NEGATIVE
Protein, ur: 30 mg/dL — AB
Specific Gravity, Urine: 1.025 (ref 1.005–1.030)
pH: 5.5 (ref 5.0–8.0)

## 2022-05-11 LAB — WET PREP, GENITAL
Sperm: NONE SEEN
Trich, Wet Prep: NONE SEEN
WBC, Wet Prep HPF POC: <10 (ref <10)
Yeast Wet Prep HPF POC: NONE SEEN

## 2022-05-11 LAB — LIPASE, BLOOD: Lipase: 22 U/L (ref 11–51)

## 2022-05-11 LAB — URINALYSIS, MICROSCOPIC (REFLEX)

## 2022-05-11 LAB — HIV ANTIBODY (ROUTINE TESTING W REFLEX): HIV Screen 4th Generation wRfx: NONREACTIVE

## 2022-05-11 LAB — CBG MONITORING, ED: Glucose-Capillary: 110 mg/dL — ABNORMAL HIGH (ref 70–99)

## 2022-05-11 LAB — PREGNANCY, URINE: Preg Test, Ur: NEGATIVE

## 2022-05-11 MED ORDER — DOXYCYCLINE HYCLATE 100 MG PO CAPS: 100.0000 mg | ORAL_CAPSULE | Freq: Two times a day (BID) | ORAL | 0 refills | Status: AC

## 2022-05-11 MED ORDER — METRONIDAZOLE 500 MG PO TABS
500.0000 mg | ORAL_TABLET | Freq: Once | ORAL | Status: AC
  Administered 2022-05-11: 500 mg via ORAL
  Filled 2022-05-11: qty 1

## 2022-05-11 MED ORDER — METRONIDAZOLE 500 MG PO TABS: 500.0000 mg | ORAL_TABLET | Freq: Two times a day (BID) | ORAL | 0 refills | Status: DC

## 2022-05-11 MED ORDER — ACETAMINOPHEN 325 MG PO TABS
650.0000 mg | ORAL_TABLET | Freq: Once | ORAL | Status: AC
  Administered 2022-05-11: 650 mg via ORAL
  Filled 2022-05-11: qty 2

## 2022-05-11 MED ORDER — LIDOCAINE HCL (PF) 1 % IJ SOLN
1.0000 mL | Freq: Once | INTRAMUSCULAR | Status: AC
  Administered 2022-05-11: 2.1 mL
  Filled 2022-05-11: qty 5

## 2022-05-11 MED ORDER — CEFTRIAXONE SODIUM 1 G IJ SOLR
1.0000 g | Freq: Once | INTRAMUSCULAR | Status: AC
  Administered 2022-05-11: 1 g via INTRAMUSCULAR
  Filled 2022-05-11: qty 10

## 2022-05-11 MED ORDER — DOXYCYCLINE HYCLATE 100 MG PO TABS
100.0000 mg | ORAL_TABLET | Freq: Once | ORAL | Status: AC
  Administered 2022-05-11: 100 mg via ORAL
  Filled 2022-05-11: qty 1

## 2022-05-11 NOTE — ED Provider Notes (Signed)
[facility] EMERGENCY DEPARTMENT Provider Note   CSN: [id number] Arrival date & time: 05/11/22  7672     History  Chief Complaint  Patient presents with   Abdominal Pain    Kristin Wood is a 31 y.o. female.  Patient is a 31 year old female with a past medical history of diabetes with medication noncompliance presenting to the emergency department with abdominal pain.  The patient states that for the last week she has had lower abdominal and back pain.  She states that she initially thought it was her menstrual cycle and had her normal cycle for 2 days last Sunday and Monday.  She states that she has continued to have some spotting since then.  She also reports that she has had increased vaginal discharge that is foul-smelling.  She states that she has had a few episodes of loose stool.  She denies any dysuria but does report hematuria.  She denies any fevers or chills.  She reports nausea but denies any vomiting.  She states that she has had her tubes tied.  She states that she has had recent unprotected sex with a new partner.  The history is provided by the patient.  Abdominal Pain      Home Medications Prior to Admission medications   Medication Sig Start Date End Date Taking? Authorizing Provider  doxycycline (VIBRAMYCIN) 100 MG capsule Take 1 capsule (100 mg total) by mouth 2 (two) times daily for 7 days. 05/11/22 05/18/22 Yes Maylon Peppers, Jordan Hawks K, DO  metroNIDAZOLE (FLAGYL) 500 MG tablet Take 1 tablet (500 mg total) by mouth 2 (two) times daily. 05/11/22  Yes Maylon Peppers, Jordan Hawks K, DO  metFORMIN (GLUCOPHAGE) 500 MG tablet Take 1 tablet (500 mg total) by mouth 2 (two) times daily with a meal. 07/11/19   Horton, Barbette Hair, MD  ondansetron (ZOFRAN ODT) 4 MG disintegrating tablet Take 1 tablet (4 mg total) by mouth every 8 (eight) hours as needed for nausea or vomiting. 05/13/21   Hendricks Limes, PA-C  oxyCODONE-acetaminophen (PERCOCET/ROXICET) 5-325 MG tablet Take 1  tablet by mouth every 6 (six) hours as needed for severe pain. 05/13/21   Hendricks Limes, PA-C      Allergies    Patient has no known allergies.    Review of Systems   Review of Systems  Gastrointestinal:  Positive for abdominal pain.    Physical Exam Updated Vital Signs BP (!) 157/89 (BP Location: Right Arm)   Pulse 79   Temp 98.2 F (36.8 C) (Oral)   Resp 20   Ht 5\' 2"  (1.575 m)   Wt (!) 156.5 kg   LMP 05/05/2022   SpO2 100%   BMI 63.10 kg/m  Physical Exam Vitals and nursing note reviewed.  Constitutional:      General: She is not in acute distress.    Appearance: She is well-developed. She is obese.  HENT:     Head: Normocephalic and atraumatic.  Eyes:     Extraocular Movements: Extraocular movements intact.     Pupils: Pupils are equal, round, and reactive to light.  Cardiovascular:     Rate and Rhythm: Normal rate and regular rhythm.     Heart sounds: Normal heart sounds.  Pulmonary:     Effort: Pulmonary effort is normal.     Breath sounds: Normal breath sounds.  Abdominal:     General: Abdomen is flat.     Palpations: Abdomen is soft.     Tenderness: There is abdominal tenderness (mild) in the  suprapubic area. There is no right CVA tenderness, left CVA tenderness, guarding or rebound.  Genitourinary:    Vagina: Normal. No vaginal discharge or bleeding.     Cervix: No cervical motion tenderness or discharge.     Uterus: Normal.      Adnexa: Right adnexa normal and left adnexa normal.       Right: No tenderness.         Left: No tenderness.    Skin:    General: Skin is warm and dry.  Neurological:     General: No focal deficit present.     Mental Status: She is alert and oriented to person, place, and time.  Psychiatric:        Mood and Affect: Mood normal.        Behavior: Behavior normal.     ED Results / Procedures / Treatments   Labs (all labs ordered are listed, but only abnormal results are displayed) Labs Reviewed  WET PREP, GENITAL -  Abnormal; Notable for the following components:      Result Value   Clue Cells Wet Prep HPF POC PRESENT (*)    All other components within normal limits  COMPREHENSIVE METABOLIC PANEL - Abnormal; Notable for the following components:   Glucose, Bld 136 (*)    Calcium 8.8 (*)    All other components within normal limits  CBC - Abnormal; Notable for the following components:   MCV 76.2 (*)    MCH 25.0 (*)    All other components within normal limits  URINALYSIS, ROUTINE W REFLEX MICROSCOPIC - Abnormal; Notable for the following components:   Protein, ur 30 (*)    All other components within normal limits  URINALYSIS, MICROSCOPIC (REFLEX) - Abnormal; Notable for the following components:   Bacteria, UA FEW (*)    All other components within normal limits  CBG MONITORING, ED - Abnormal; Notable for the following components:   Glucose-Capillary 110 (*)    All other components within normal limits  LIPASE, BLOOD  PREGNANCY, URINE  RPR  HIV ANTIBODY (ROUTINE TESTING W REFLEX)  GC/CHLAMYDIA PROBE AMP () NOT AT [facility]    EKG None  Radiology No results found.  Procedures Procedures    Medications Ordered in ED Medications  cefTRIAXone (ROCEPHIN) injection 1 g (has no administration in time range)  lidocaine (PF) (XYLOCAINE) 1 % injection 1-2.1 mL (has no administration in time range)  doxycycline (VIBRA-TABS) tablet 100 mg (has no administration in time range)  metroNIDAZOLE (FLAGYL) tablet 500 mg (has no administration in time range)  acetaminophen (TYLENOL) tablet 650 mg (650 mg Oral Given 05/11/22 1103)    ED Course/ Medical Decision Making/ A&P Clinical Course as of 05/11/22 1210  Sat May 11, 2022  1204 Patient had no CMT or adnexal tenderness on pelvic exam making PID unlikely.  Wet mount is positive for clue cells.  Patient will be treated for BV.  She states she would also like prophylactic treatment for GC and chlamydia as one of her partners recently tested  positive for chlamydia.  Remainder of her labs are within normal range.  She is stable for discharge home with primary care or OB/GYN follow-up. [VK]    Clinical Course User Index [VK] Rexford Maus, DO                           Medical Decision Making This patient presents to the ED with chief complaint(s) of  abdominal/back pain with pertinent past medical history of diabetes and medication non-compliance which further complicates the presenting complaint. The complaint involves an extensive differential diagnosis and also carries with it a high risk of complications and morbidity.    The differential diagnosis includes hyperglycemia, DKA, UTI, pregnancy, ectopic, STI, PID, TOA, patient has no CVA tenderness making pyelonephritis less likely, she has no point abdominal tenderness making appendicitis or diverticulitis less likely  Additional history obtained: Additional history obtained from N/A Records reviewed N/A  ED Course and Reassessment: Since arrival to the emergency department she is having abdominal pain but is hemodynamically stable and well-appearing.  She will have urine and labs performed.  She will require pelvic exam and is requesting STD testing to evaluate for possible PID or TOA.  Was given Tylenol for pain.  Independent labs interpretation:  The following labs were independently interpreted: Wet mount positive for clue cells, otherwise work-up negative  Independent visualization of imaging: N/A  Consultation: - Consulted or discussed management/test interpretation w/ external professional: N/A  Consideration for admission or further workup: Patient has no emergent conditions requiring admission at this time and is stable for discharge home with primary care follow-up Social Determinants of health: N/A    Amount and/or Complexity of Data Reviewed Labs: ordered.  Risk OTC drugs. Prescription drug management.          Final Clinical Impression(s)  / ED Diagnoses Final diagnoses:  Bacterial vaginosis  STD exposure    Rx / DC Orders ED Discharge Orders          Ordered    doxycycline (VIBRAMYCIN) 100 MG capsule  2 times daily        05/11/22 1206    metroNIDAZOLE (FLAGYL) 500 MG tablet  2 times daily        05/11/22 1206              Elayne Snare K, DO 05/11/22 1210

## 2022-05-11 NOTE — ED Triage Notes (Signed)
Pt arrives pov, steady gait, c/o lower abdominal with loose stool pain x 4 days. Also endorses lower back pain. Took tylenol last night with some relief.

## 2022-05-11 NOTE — Discharge Instructions (Addendum)
You were seen in the emergency department for your abdominal pain.  Your work-up shows that you have bacterial vaginosis.  This is not an STD but can commonly occur with sex and causes growth of the abnormal bacteria in your vagina causing increased discharge and abdominal pain.  This is treated with antibiotics and you should complete this as prescribed.  We also tested you today for gonorrhea and chlamydia and these tests have not come back.  We did treat you prophylactically with a shot and gave you a prescription for a week of pills.  You can follow-up your results on your patient portal and if both tests are negative you can stop the pills but if either test is positive you should complete the prescription as prescribed.  You should follow-up with your primary doctor or your OB/GYN in the next few days to have your symptoms rechecked.  You should abstain from sex for at least 7 days after completing your treatment and recommend that your partners get tested and treated as well to prevent reinfection.  You should return to the emergency department if you have significantly worsening pain, repetitive vomiting, fevers or any other new or concerning symptoms.

## 2022-05-12 LAB — RPR: RPR Ser Ql: NONREACTIVE

## 2022-05-13 LAB — GC/CHLAMYDIA PROBE AMP (~~LOC~~) NOT AT ARMC
Chlamydia: NEGATIVE
Comment: NEGATIVE
Comment: NORMAL
Neisseria Gonorrhea: NEGATIVE

## 2022-11-10 ENCOUNTER — Other Ambulatory Visit: Payer: Self-pay

## 2022-11-10 ENCOUNTER — Emergency Department (HOSPITAL_BASED_OUTPATIENT_CLINIC_OR_DEPARTMENT_OTHER)
Admission: EM | Admit: 2022-11-10 | Discharge: 2022-11-10 | Disposition: A | Discharge status: Home / Self Care | Payer: Medicaid Other | Attending: Emergency Medicine | Admitting: Emergency Medicine

## 2022-11-10 ENCOUNTER — Emergency Department (HOSPITAL_BASED_OUTPATIENT_CLINIC_OR_DEPARTMENT_OTHER): Payer: Medicaid Other

## 2022-11-10 DIAGNOSIS — E1165 Type 2 diabetes mellitus with hyperglycemia: Secondary | ICD-10-CM | POA: Diagnosis not present

## 2022-11-10 DIAGNOSIS — L02215 Cutaneous abscess of perineum: Secondary | ICD-10-CM | POA: Diagnosis present

## 2022-11-10 DIAGNOSIS — D72829 Elevated white blood cell count, unspecified: Secondary | ICD-10-CM | POA: Diagnosis not present

## 2022-11-10 DIAGNOSIS — Z7984 Long term (current) use of oral hypoglycemic drugs: Secondary | ICD-10-CM | POA: Insufficient documentation

## 2022-11-10 DIAGNOSIS — R739 Hyperglycemia, unspecified: Secondary | ICD-10-CM

## 2022-11-10 LAB — COMPREHENSIVE METABOLIC PANEL
ALT: 19 U/L (ref 0–44)
AST: 15 U/L (ref 15–41)
Albumin: 3.6 g/dL (ref 3.5–5.0)
Alkaline Phosphatase: 84 U/L (ref 38–126)
Anion gap: 7 (ref 5–15)
BUN: 7 mg/dL (ref 6–20)
CO2: 27 mmol/L (ref 22–32)
Calcium: 8.4 mg/dL — ABNORMAL LOW (ref 8.9–10.3)
Chloride: 100 mmol/L (ref 98–111)
Creatinine, Ser: 0.75 mg/dL (ref 0.44–1.00)
GFR, Estimated: >60 mL/min (ref >60)
Glucose, Bld: 221 mg/dL — ABNORMAL HIGH (ref 70–99)
Potassium: 3.8 mmol/L (ref 3.5–5.1)
Sodium: 134 mmol/L — ABNORMAL LOW (ref 135–145)
Total Bilirubin: 0.9 mg/dL (ref 0.3–1.2)
Total Protein: 7.9 g/dL (ref 6.5–8.1)

## 2022-11-10 LAB — CBC WITH DIFFERENTIAL/PLATELET
Abs Immature Granulocytes: 0.05 10*3/uL (ref 0.00–0.07)
Basophils Absolute: 0 10*3/uL (ref 0.0–0.1)
Basophils Relative: 0 %
Eosinophils Absolute: 0.1 10*3/uL (ref 0.0–0.5)
Eosinophils Relative: 0 %
HCT: 36.8 % (ref 36.0–46.0)
Hemoglobin: 12.1 g/dL (ref 12.0–15.0)
Immature Granulocytes: 0 %
Lymphocytes Relative: 14 %
Lymphs Abs: 2.1 10*3/uL (ref 0.7–4.0)
MCH: 24.5 pg — ABNORMAL LOW (ref 26.0–34.0)
MCHC: 32.9 g/dL (ref 30.0–36.0)
MCV: 74.5 fL — ABNORMAL LOW (ref 80.0–100.0)
Monocytes Absolute: 1.1 10*3/uL — ABNORMAL HIGH (ref 0.1–1.0)
Monocytes Relative: 8 %
Neutro Abs: 11.4 10*3/uL — ABNORMAL HIGH (ref 1.7–7.7)
Neutrophils Relative %: 78 %
Platelets: 252 10*3/uL (ref 150–400)
RBC: 4.94 MIL/uL (ref 3.87–5.11)
RDW: 13.9 % (ref 11.5–15.5)
WBC: 14.6 10*3/uL — ABNORMAL HIGH (ref 4.0–10.5)
nRBC: 0 % (ref 0.0–0.2)

## 2022-11-10 LAB — HCG, SERUM, QUALITATIVE: Preg, Serum: NEGATIVE

## 2022-11-10 MED ORDER — MORPHINE SULFATE (PF) 4 MG/ML IV SOLN: 4.0000 mg | Freq: Once | INTRAVENOUS | Status: DC

## 2022-11-10 MED ORDER — IOHEXOL 300 MG/ML  SOLN
100.0000 mL | Freq: Once | INTRAMUSCULAR | Status: AC | PRN
  Administered 2022-11-10: 100 mL via INTRAVENOUS

## 2022-11-10 MED ORDER — SODIUM CHLORIDE 0.9 % IV SOLN
1.0000 g | Freq: Once | INTRAVENOUS | Status: AC
  Administered 2022-11-10: 1 g via INTRAVENOUS
  Filled 2022-11-10: qty 10

## 2022-11-10 MED ORDER — FENTANYL CITRATE PF 50 MCG/ML IJ SOSY
50.0000 ug | PREFILLED_SYRINGE | Freq: Once | INTRAMUSCULAR | Status: AC
  Administered 2022-11-10: 50 ug via INTRAVENOUS
  Filled 2022-11-10: qty 1

## 2022-11-10 MED ORDER — FLUCONAZOLE 150 MG PO TABS: ORAL_TABLET | ORAL | 0 refills | Status: AC

## 2022-11-10 MED ORDER — OXYCODONE HCL 5 MG PO TABS: 2.5000 mg | ORAL_TABLET | ORAL | 0 refills | Status: AC | PRN

## 2022-11-10 MED ORDER — METFORMIN HCL 500 MG PO TABS: 500.0000 mg | ORAL_TABLET | Freq: Two times a day (BID) | ORAL | 0 refills | Status: AC

## 2022-11-10 MED ORDER — METFORMIN HCL 850 MG PO TABS: 850.0000 mg | ORAL_TABLET | Freq: Two times a day (BID) | ORAL | 1 refills | Status: AC

## 2022-11-10 MED ORDER — CLINDAMYCIN HCL 300 MG PO CAPS: 300.0000 mg | ORAL_CAPSULE | Freq: Four times a day (QID) | ORAL | 0 refills | Status: AC

## 2022-11-10 MED ORDER — LIDOCAINE-EPINEPHRINE (PF) 2 %-1:200000 IJ SOLN
20.0000 mL | Freq: Once | INTRAMUSCULAR | Status: AC
  Administered 2022-11-10: 20 mL
  Filled 2022-11-10: qty 20

## 2022-11-10 MED ORDER — ONDANSETRON HCL 4 MG/2ML IJ SOLN
4.0000 mg | Freq: Once | INTRAMUSCULAR | Status: AC
  Administered 2022-11-10: 4 mg via INTRAVENOUS
  Filled 2022-11-10: qty 2

## 2022-11-10 MED ORDER — SODIUM CHLORIDE 0.9 % IV BOLUS
1000.0000 mL | Freq: Once | INTRAVENOUS | Status: AC
  Administered 2022-11-10: 1000 mL via INTRAVENOUS

## 2022-11-10 NOTE — ED Notes (Signed)
Discharge paperwork reviewed entirely with patient, including Rx's and follow up care. Pain was under control. Pt verbalized understanding as well as all parties involved. No questions or concerns voiced at the time of discharge. No acute distress noted.   Pt ambulated out to PVA without incident or assistance.  

## 2022-11-10 NOTE — ED Triage Notes (Signed)
States has and abscess from inner left thigh wrapping around to left lower buttocks x 2 days. States has been using warm compresses & now having some drainage.

## 2022-11-10 NOTE — ED Provider Notes (Signed)
[facility] Provider Note   CSN: [id number] Arrival date & time: 11/10/22  1423     History  Chief Complaint  Patient presents with   Abscess    Kristin Wood is a 32 y.o. female.  The history is provided by the patient.  Abscess Location:  Pelvis Pelvic abscess location:  L buttock, perianal and perineum Abscess quality: draining, induration, painful, redness and warmth   Duration:  2 days Progression:  Worsening Pain details:    Quality:  Burning, pressure and aching   Severity:  Severe   Duration:  2 days   Timing:  Constant   Progression:  Worsening Chronicity:  New Context: diabetes (not currently taking meds)   Context: not immunosuppression, not injected drug use, not insect bite/sting and not skin injury   Relieved by:  Warm compresses and warm water soaks Exacerbated by: pressure, movement, sitting, walking. Ineffective treatments:  Warm compresses and warm water soaks Associated symptoms: no fever   Risk factors: hx of MRSA and prior abscess   Risk factors: no family hx of MRSA        Home Medications Prior to Admission medications   Medication Sig Start Date End Date Taking? Authorizing Provider  clindamycin (CLEOCIN) 300 MG capsule Take 1 capsule (300 mg total) by mouth 4 (four) times daily. X 7 days 11/10/22  Yes Grisela Mesch, PA-C  fluconazole (DIFLUCAN) 150 MG tablet Take one tablet by mouth once at the beginning of antibiotic regimen and one tablet at completion of your antibiotic. 11/10/22  Yes Vasily Fedewa, PA-C  metFORMIN (GLUCOPHAGE) 500 MG tablet Take 1 tablet (500 mg total) by mouth 2 (two) times daily with a meal for 14 days. 11/10/22 11/24/22 Yes Jc Veron, PA-C  metFORMIN (GLUCOPHAGE) 850 MG tablet Take 1 tablet (850 mg total) by mouth 2 (two) times daily with a meal. 11/10/22 01/09/23 Yes Tashunda Vandezande, PA-C  oxyCODONE (ROXICODONE) 5 MG immediate release tablet Take 0.5-1 tablets (2.5-5 mg  total) by mouth every 4 (four) hours as needed for severe pain. 11/10/22  Yes Gen Clagg, PA-C  ondansetron (ZOFRAN ODT) 4 MG disintegrating tablet Take 1 tablet (4 mg total) by mouth every 8 (eight) hours as needed for nausea or vomiting. 05/13/21   Teressa Lower, PA-C  oxyCODONE-acetaminophen (PERCOCET/ROXICET) 5-325 MG tablet Take 1 tablet by mouth every 6 (six) hours as needed for severe pain. 05/13/21   Teressa Lower, PA-C      Allergies    Patient has no known allergies.    Review of Systems   Review of Systems  Constitutional:  Negative for fever.    Physical Exam Updated Vital Signs BP (!) 150/78   Pulse 98   Temp 97.9 F (36.6 C) (Oral)   Resp 14   Ht 5\' 3"  (1.6 m)   Wt (!) 142.4 kg   LMP 11/03/2022 (Approximate)   SpO2 99%   BMI 55.62 kg/m  Physical Exam  ED Results / Procedures / Treatments   Labs (all labs ordered are listed, but only abnormal results are displayed) Labs Reviewed  COMPREHENSIVE METABOLIC PANEL - Abnormal; Notable for the following components:      Result Value   Sodium 134 (*)    Glucose, Bld 221 (*)    Calcium 8.4 (*)    All other components within normal limits  CBC WITH DIFFERENTIAL/PLATELET - Abnormal; Notable for the following components:   WBC 14.6 (*)    MCV 74.5 (*)  MCH 24.5 (*)    Neutro Abs 11.4 (*)    Monocytes Absolute 1.1 (*)    All other components within normal limits  HCG, SERUM, QUALITATIVE    EKG None  Radiology CT PELVIS W CONTRAST  Result Date: 11/10/2022 CLINICAL DATA:  Some drainage associated with an abscess that the patient reports extends from the inner left thigh around to the left lower buttocks for the past 2 days. EXAM: CT PELVIS WITH CONTRAST TECHNIQUE: Multidetector CT imaging of the pelvis was performed using the standard protocol following the bolus administration of intravenous contrast. RADIATION DOSE REDUCTION: This exam was performed according to the departmental dose-optimization  program which includes automated exposure control, adjustment of the mA and/or kV according to patient size and/or use of iterative reconstruction technique. CONTRAST:  100mL OMNIPAQUE IOHEXOL 300 MG/ML  SOLN COMPARISON:  Abdomen and pelvis CT dated 05/13/2021. FINDINGS: Urinary Tract:  Unremarkable. Bowel:  Unremarkable. Vascular/Lymphatic: Interval mildly enlarged bilateral inguinal lymph nodes. On image number 100/3, these include a right inguinal node with a short axis diameter of 20 mm and a left inguinal node with a short axis diameter of 22 mm. There is also interval pelvic adenopathy bilaterally. The largest node is a common femoral node on the right with a short axis diameter of 19 mm. Reproductive:  Unremarkable uterus and ovaries. Other:  Small umbilical hernia containing fat. Musculoskeletal: Stable C-section scar. Interval soft tissue stranding in the subcutaneous fat of the posteromedial left buttock region and medial proximal thigh extending into the perineal region. There is a more confluent oval area of low density just beneath the skin at the crease of the lower buttocks on the left. This measures 2.5 x 1.7 cm on image number 143/3 and 1.2 cm in length on sagittal image number 82/7. This is not well-defined without intravenous contrast. IMPRESSION: 1. Cellulitis in the subcutaneous fat of the posteromedial left buttock region and medial proximal left thigh extending into the perineal region with a 2.5 x 1.7 x 1.2 cm probable developing abscess just beneath the skin at the crease of the lower buttocks on the left. 2. Interval bilateral inguinal and pelvic adenopathy, most likely reactive. Electronically Signed   By: Beckie SaltsSteven  Reid M.D.   On: 11/10/2022 11:40    Procedures Procedures    Medications Ordered in ED Medications  sodium chloride 0.9 % bolus 1,000 mL (0 mLs Intravenous Stopped 11/10/22 1158)  fentaNYL (SUBLIMAZE) injection 50 mcg (50 mcg Intravenous Given 11/10/22 1016)  ondansetron  (ZOFRAN) injection 4 mg (4 mg Intravenous Given 11/10/22 1017)  cefTRIAXone (ROCEPHIN) 1 g in sodium chloride 0.9 % 100 mL IVPB (0 g Intravenous Stopped 11/10/22 1050)  iohexol (OMNIPAQUE) 300 MG/ML solution 100 mL (100 mLs Intravenous Contrast Given 11/10/22 1108)  lidocaine-EPINEPHrine (XYLOCAINE W/EPI) 2 %-1:200000 (PF) injection 20 mL (20 mLs Infiltration Given by Other 11/10/22 1207)  fentaNYL (SUBLIMAZE) injection 50 mcg (50 mcg Intravenous Given 11/10/22 1230)    ED Course/ Medical Decision Making/ A&P Clinical Course as of 11/10/22 1858  Sun Nov 10, 2022  1049 Glucose(!): 221 [AH]  1050 WBC(!): 14.6 [AH]    Clinical Course User Index [AH] Arthor CaptainHarris, Ameir Faria, PA-C                             Medical Decision Making Patient here with abscess of the left thigh and perineal region.  Striae MRSA and diabetes.  She is not taking her medication.  I have low suspicion for female Fournier's however this is a large area of tissue and wanted to make sure she did have deeper infection tracking back into the gluteus that I would not be able to access efficiently here in the emergency department.  I ordered labs including CMP which shows glucose of 221, CBC shows a leukocytosis with with a white count of 14.6.  Negative pregnancy test.  I ordered and independently interpreted CT pelvis with contrast which shows reactive lymphadenopathy cellulitis of the left buttock region and needle abscess.   In the workup patient states that her abscess began draining copiously and there is a large amount of purulent discharge on the sheets.  I reevaluated the area and it appears to be actively draining copious amounts of pus and I do not think that we need to incise and drain it at this time.  Given these facts we will treat the patient with clindamycin for abscess and cellulitis.  Will restart her on her metformin, have also ordered pain medications after PDMP review.  Patient is advised to follow closely with her primary  care doctor for management of diabetes which is likely the cause of her getting this infection today.  Patient feels comfortable with plan will discharge and have her follow-up in the outpatient setting.  Discussed return precautions  Amount and/or Complexity of Data Reviewed Labs: ordered. Decision-making details documented in ED Course. Radiology: ordered.  Risk Prescription drug management.           Final Clinical Impression(s) / ED Diagnoses Final diagnoses:  Perineal abscess, superficial  Elevated blood sugar level    Rx / DC Orders ED Discharge Orders          Ordered    clindamycin (CLEOCIN) 300 MG capsule  4 times daily        11/10/22 1243    fluconazole (DIFLUCAN) 150 MG tablet        11/10/22 1243    metFORMIN (GLUCOPHAGE) 500 MG tablet  2 times daily with meals        11/10/22 1243    metFORMIN (GLUCOPHAGE) 850 MG tablet  2 times daily with meals        11/10/22 1243    oxyCODONE (ROXICODONE) 5 MG immediate release tablet  Every 4 hours PRN        11/10/22 1243              Arthor Captain, PA-C 11/10/22 1858    Derwood Kaplan, MD 11/11/22 [phone]

## 2022-11-10 NOTE — Discharge Instructions (Signed)
1.  Take the 500 mg metformin twice daily for 2 weeks and then transition to 850 mg 2 times daily. 2.  Make sure to complete the entire course of antibiotics I have prescribed for treatment of your infection. 3.  You may use over-the-counter pain control including Tylenol and Advil along with the oxycodone I have prescribed for severe pain. 4.  You may take your first dose of fluconazole at the beginning of your antibiotic treatment and finish with a single dose when you have completed to avoid yeast infection.   These make sure to follow closely with her primary care doctor for management of your ongoing health needs.   Contact a health care provider if: You see redness that spreads quickly or red streaks on your skin spreading away from the abscess. You have any signs of worse infection at the abscess. You vomit every time you eat or drink. You have a fever, chills, or muscle aches. The cyst or abscess returns. Get help right away if: You have severe pain. You make less pee (urine) than normal.
